# Patient Record
Sex: Female | Born: 1990 | Race: Black or African American | Hispanic: No | Marital: Single | Smoking: Never smoker
Health system: Southern US, Community
[De-identification: ages and names within clinical notes are randomized; demographics above are authoritative.]

## PROBLEM LIST (undated history)

## (undated) HISTORY — PX: OTHER SURGICAL HISTORY: SHX169

---

## 2016-04-14 ENCOUNTER — Ambulatory Visit (INDEPENDENT_AMBULATORY_CARE_PROVIDER_SITE_OTHER): Payer: Self-pay | Admitting: Obstetrics & Gynecology

## 2016-04-14 ENCOUNTER — Encounter: Payer: Self-pay | Admitting: Obstetrics & Gynecology

## 2016-04-14 VITALS — BP 121/80 | HR 74 | Temp 97.6°F | Ht 66.0 in | Wt 209.9 lb

## 2016-04-14 DIAGNOSIS — N926 Irregular menstruation, unspecified: Secondary | ICD-10-CM

## 2016-04-14 NOTE — Progress Notes (Signed)
   Subjective:    Patient ID: Cheryl Greene, female    DOB: 12-Mar-1990, 26 y.o.   MRN: [id number]  HPI 26 yo engaged AA P27 (57 yo son) here today with the issue of heavy periods since the c/s of her son 9 years ago. Her periods are not exactly regular. She skipped 4 months this summer. She had a regular period in Oct and November. Since then she had some spotting around Christmas, a light period in January. The first week of March it was very heavy.  She was on depo provera for about 5-6 years after her delivery and OCPs for about 2 years.  She has only a few chin hairs that are an issue.  She stopped contraception in about a year ago because she would like a pregnancy.  She has recurrent BV.   Review of Systems She moved here from [place] for her job at [employer] Her last pap smear was this year and was normal.    Objective:   Physical Exam WNWBFNAD Breathing, conversing, and ambulating normally Abd- benign Normal nulliparous cervix, moderate amount of mucousy blood ULN size AV uterus, NT, nl palpable adnexal masses       Assessment & Plan:  Recurrent BV- rec Boric acid supp Desire for pregnancy- start MVI daily to prevent spina bifida Irregular periods- check TSH, gyn u/s RTC 2 weeks

## 2016-04-14 NOTE — Progress Notes (Signed)
Patient is here for AUB, very heavy periods lasting more than 10 days. LMP 01/19/16, still bleeding.

## 2016-04-15 LAB — CBC
HEMOGLOBIN: 8.4 g/dL — AB (ref 11.1–15.9)
Hematocrit: 25.8 % — ABNORMAL LOW (ref 34.0–46.6)
MCH: 29.1 pg (ref 26.6–33.0)
MCHC: 32.6 g/dL (ref 31.5–35.7)
MCV: 89 fL (ref 79–97)
Platelets: 325 10*3/uL (ref 150–379)
RBC: 2.89 x10E6/uL — AB (ref 3.77–5.28)
RDW: 14.7 % (ref 12.3–15.4)
WBC: 4.8 10*3/uL (ref 3.4–10.8)

## 2016-04-15 LAB — TSH: TSH: 0.709 u[IU]/mL (ref 0.450–4.500)

## 2016-04-18 ENCOUNTER — Ambulatory Visit (HOSPITAL_COMMUNITY)
Admission: RE | Admit: 2016-04-18 | Discharge: 2016-04-18 | Disposition: A | Discharge status: Home / Self Care | Payer: Medicaid Other | Source: Ambulatory Visit | Attending: Obstetrics & Gynecology | Admitting: Obstetrics & Gynecology

## 2016-04-18 DIAGNOSIS — N926 Irregular menstruation, unspecified: Secondary | ICD-10-CM | POA: Insufficient documentation

## 2016-04-23 ENCOUNTER — Encounter: Payer: Self-pay | Admitting: General Practice

## 2016-05-14 ENCOUNTER — Ambulatory Visit (INDEPENDENT_AMBULATORY_CARE_PROVIDER_SITE_OTHER): Payer: Self-pay | Admitting: Obstetrics & Gynecology

## 2016-05-14 VITALS — BP 128/74 | HR 76 | Ht 66.0 in | Wt 213.5 lb

## 2016-05-14 DIAGNOSIS — N926 Irregular menstruation, unspecified: Secondary | ICD-10-CM

## 2016-05-14 MED ORDER — NORGESTREL-ETHINYL ESTRADIOL 0.3-30 MG-MCG PO TABS: 1.0000 | ORAL_TABLET | Freq: Every day | ORAL | 11 refills | Status: AC

## 2016-05-14 MED ORDER — FERROUS SULFATE 325 (65 FE) MG PO TBEC: 325.0000 mg | DELAYED_RELEASE_TABLET | Freq: Three times a day (TID) | ORAL | 3 refills | Status: AC

## 2016-05-14 NOTE — Progress Notes (Signed)
   Subjective:    Patient ID: Cheryl Greene, female    DOB: 1990/02/12, 26 y.o.   MRN: [id number]  HPI 26 yo S AA P1 (18 yo son) here for follow up for work up of heavy irregular periods. Her u/s was normal but her hbg was 8.4.  She is feeling anemic and has felt this way in the past, after the delivery of her son. Her symptoms are of craving some ice but mostly baby powder. She has been eating about 1/3 of a small bottle daily for about the last month. This is constipating her. Even though she would like a pregnancy, she is willing to take OCPs to regulate/lighten her periods, also willing to take iron BID.   Review of Systems     Objective:   Physical Exam WNWHBFNAD Breathing, conversing, and ambulating normally       Assessment & Plan:  As above-  Iron BID Start OCPs with NMP Start MVI daily RTC 4 weeks for re check of CBC

## 2016-06-11 ENCOUNTER — Other Ambulatory Visit: Payer: Self-pay

## 2016-06-11 DIAGNOSIS — N921 Excessive and frequent menstruation with irregular cycle: Secondary | ICD-10-CM

## 2016-06-12 LAB — CBC
HEMATOCRIT: 32.4 % — AB (ref 34.0–46.6)
HEMOGLOBIN: 10.2 g/dL — AB (ref 11.1–15.9)
MCH: 26.4 pg — ABNORMAL LOW (ref 26.6–33.0)
MCHC: 31.5 g/dL (ref 31.5–35.7)
MCV: 84 fL (ref 79–97)
Platelets: 236 10*3/uL (ref 150–379)
RBC: 3.87 x10E6/uL (ref 3.77–5.28)
RDW: 19 % — AB (ref 12.3–15.4)
WBC: 5.3 10*3/uL (ref 3.4–10.8)

## 2016-11-07 ENCOUNTER — Inpatient Hospital Stay (HOSPITAL_COMMUNITY)
Admission: EM | Admit: 2016-11-07 | Discharge: 2016-11-10 | DRG: 872 | Disposition: A | Discharge status: Home / Self Care | Payer: Medicaid Other | Attending: Internal Medicine | Admitting: Internal Medicine

## 2016-11-07 ENCOUNTER — Encounter (HOSPITAL_COMMUNITY): Payer: Self-pay | Admitting: Emergency Medicine

## 2016-11-07 ENCOUNTER — Emergency Department (HOSPITAL_COMMUNITY): Payer: Medicaid Other

## 2016-11-07 DIAGNOSIS — D638 Anemia in other chronic diseases classified elsewhere: Secondary | ICD-10-CM | POA: Diagnosis present

## 2016-11-07 DIAGNOSIS — A419 Sepsis, unspecified organism: Secondary | ICD-10-CM

## 2016-11-07 DIAGNOSIS — N12 Tubulo-interstitial nephritis, not specified as acute or chronic: Secondary | ICD-10-CM | POA: Diagnosis present

## 2016-11-07 DIAGNOSIS — A4151 Sepsis due to Escherichia coli [E. coli]: Principal | ICD-10-CM | POA: Diagnosis present

## 2016-11-07 LAB — URINALYSIS, ROUTINE W REFLEX MICROSCOPIC
Bilirubin Urine: NEGATIVE
GLUCOSE, UA: 50 mg/dL — AB
Ketones, ur: 5 mg/dL — AB
NITRITE: POSITIVE — AB
PH: 6 (ref 5.0–8.0)
Protein, ur: 100 mg/dL — AB
SPECIFIC GRAVITY, URINE: 1.014 (ref 1.005–1.030)

## 2016-11-07 LAB — I-STAT TROPONIN, ED: TROPONIN I, POC: 0 ng/mL (ref 0.00–0.08)

## 2016-11-07 LAB — CBC WITH DIFFERENTIAL/PLATELET
BASOS PCT: 0 %
Basophils Absolute: 0 10*3/uL (ref 0.0–0.1)
EOS ABS: 0 10*3/uL (ref 0.0–0.7)
EOS PCT: 0 %
HCT: 31.3 % — ABNORMAL LOW (ref 36.0–46.0)
HEMOGLOBIN: 10.5 g/dL — AB (ref 12.0–15.0)
Lymphocytes Relative: 6 %
Lymphs Abs: 0.7 10*3/uL (ref 0.7–4.0)
MCH: 29.4 pg (ref 26.0–34.0)
MCHC: 33.5 g/dL (ref 30.0–36.0)
MCV: 87.7 fL (ref 78.0–100.0)
MONO ABS: 0.5 10*3/uL (ref 0.1–1.0)
MONOS PCT: 4 %
NEUTROS PCT: 90 %
Neutro Abs: 11.2 10*3/uL — ABNORMAL HIGH (ref 1.7–7.7)
PLATELETS: 229 10*3/uL (ref 150–400)
RBC: 3.57 MIL/uL — ABNORMAL LOW (ref 3.87–5.11)
RDW: 12.7 % (ref 11.5–15.5)
WBC: 12.5 10*3/uL — ABNORMAL HIGH (ref 4.0–10.5)

## 2016-11-07 LAB — COMPREHENSIVE METABOLIC PANEL
ALK PHOS: 57 U/L (ref 38–126)
ALT: 42 U/L (ref 14–54)
AST: 39 U/L (ref 15–41)
Albumin: 3.6 g/dL (ref 3.5–5.0)
Anion gap: 11 (ref 5–15)
BUN: 9 mg/dL (ref 6–20)
CALCIUM: 8.8 mg/dL — AB (ref 8.9–10.3)
CO2: 18 mmol/L — AB (ref 22–32)
CREATININE: 0.78 mg/dL (ref 0.44–1.00)
Chloride: 106 mmol/L (ref 101–111)
Glucose, Bld: 136 mg/dL — ABNORMAL HIGH (ref 65–99)
Potassium: 3.3 mmol/L — ABNORMAL LOW (ref 3.5–5.1)
SODIUM: 135 mmol/L (ref 135–145)
Total Bilirubin: 0.9 mg/dL (ref 0.3–1.2)
Total Protein: 7.2 g/dL (ref 6.5–8.1)

## 2016-11-07 LAB — I-STAT CG4 LACTIC ACID, ED
Lactic Acid, Venous: 2.54 mmol/L (ref 0.5–1.9)
Lactic Acid, Venous: 1.79 mmol/L (ref 0.5–1.9)

## 2016-11-07 LAB — I-STAT BETA HCG BLOOD, ED (MC, WL, AP ONLY): I-stat hCG, quantitative: <5 m[IU]/mL (ref <5)

## 2016-11-07 MED ORDER — DEXTROSE 5 % IV SOLN
2.0000 g | INTRAVENOUS | Status: DC
  Administered 2016-11-08 – 2016-11-10 (×3): 2 g via INTRAVENOUS
  Filled 2016-11-07 (×3): qty 2

## 2016-11-07 MED ORDER — MORPHINE SULFATE (PF) 4 MG/ML IV SOLN
4.0000 mg | Freq: Once | INTRAVENOUS | Status: AC
  Administered 2016-11-07: 4 mg via INTRAVENOUS
  Filled 2016-11-07: qty 1

## 2016-11-07 MED ORDER — ACETAMINOPHEN 325 MG PO TABS
650.0000 mg | ORAL_TABLET | Freq: Four times a day (QID) | ORAL | Status: DC | PRN
  Administered 2016-11-07 – 2016-11-08 (×4): 650 mg via ORAL
  Filled 2016-11-07 (×4): qty 2

## 2016-11-07 MED ORDER — KETOROLAC TROMETHAMINE 30 MG/ML IJ SOLN: 15.0000 mg | Freq: Once | INTRAMUSCULAR | Status: DC

## 2016-11-07 MED ORDER — SODIUM CHLORIDE 0.9 % IV SOLN
INTRAVENOUS | Status: AC
  Administered 2016-11-07 – 2016-11-08 (×2): via INTRAVENOUS

## 2016-11-07 MED ORDER — ENOXAPARIN SODIUM 40 MG/0.4ML ~~LOC~~ SOLN
40.0000 mg | SUBCUTANEOUS | Status: DC
  Administered 2016-11-07 – 2016-11-09 (×3): 40 mg via SUBCUTANEOUS
  Filled 2016-11-07 (×3): qty 0.4

## 2016-11-07 MED ORDER — SODIUM CHLORIDE 0.9 % IV BOLUS (SEPSIS)
1000.0000 mL | Freq: Once | INTRAVENOUS | Status: AC
  Administered 2016-11-07: 1000 mL via INTRAVENOUS

## 2016-11-07 MED ORDER — KETOROLAC TROMETHAMINE 30 MG/ML IJ SOLN
30.0000 mg | Freq: Once | INTRAMUSCULAR | Status: AC
  Administered 2016-11-07: 30 mg via INTRAVENOUS
  Filled 2016-11-07: qty 1

## 2016-11-07 MED ORDER — NORGESTREL-ETHINYL ESTRADIOL 0.3-30 MG-MCG PO TABS
1.0000 | ORAL_TABLET | Freq: Every day | ORAL | Status: DC
  Administered 2016-11-07 – 2016-11-10 (×4): 1 via ORAL

## 2016-11-07 MED ORDER — CEFTRIAXONE SODIUM 2 G IJ SOLR
2.0000 g | Freq: Once | INTRAMUSCULAR | Status: AC
  Administered 2016-11-07: 2 g via INTRAVENOUS
  Filled 2016-11-07: qty 2

## 2016-11-07 MED ORDER — FERROUS SULFATE 325 (65 FE) MG PO TBEC
325.0000 mg | DELAYED_RELEASE_TABLET | Freq: Three times a day (TID) | ORAL | Status: DC
  Administered 2016-11-07 – 2016-11-08 (×2): 325 mg via ORAL
  Filled 2016-11-07 (×4): qty 1

## 2016-11-07 MED ORDER — ACETAMINOPHEN 650 MG RE SUPP: 650.0000 mg | Freq: Four times a day (QID) | RECTAL | Status: DC | PRN

## 2016-11-07 MED ORDER — ACETAMINOPHEN 325 MG PO TABS
650.0000 mg | ORAL_TABLET | Freq: Once | ORAL | Status: AC
  Administered 2016-11-07: 650 mg via ORAL
  Filled 2016-11-07: qty 2

## 2016-11-07 MED ORDER — CEFTRIAXONE SODIUM 2 G IJ SOLR: 2.0000 g | Freq: Once | INTRAMUSCULAR | Status: DC

## 2016-11-07 MED ORDER — ONDANSETRON HCL 4 MG/2ML IJ SOLN
4.0000 mg | Freq: Once | INTRAMUSCULAR | Status: AC
  Administered 2016-11-07: 4 mg via INTRAVENOUS
  Filled 2016-11-07: qty 2

## 2016-11-07 MED ORDER — ONDANSETRON HCL 4 MG/2ML IJ SOLN: 4.0000 mg | Freq: Four times a day (QID) | INTRAMUSCULAR | Status: DC | PRN

## 2016-11-07 MED ORDER — ONDANSETRON HCL 4 MG PO TABS: 4.0000 mg | ORAL_TABLET | Freq: Four times a day (QID) | ORAL | Status: DC | PRN

## 2016-11-07 NOTE — ED Provider Notes (Signed)
[facility] Provider Note   CSN: [id number] Arrival date & time: 11/07/16  [id number]     History   Chief Complaint Chief Complaint  Patient presents with  . Back Pain    HPI Cheryl Greene is a 26 y.o. female.  Cheryl Greene Cheryl Greene is a 26 y.o. Female who presents to the ED complaining of chills, mid upper back pain, SOB and dark urine. Patient reports about one week ago she started having dark colored urine, dysuria, urinary frequency and urinary urgency.  She reports it seemed to be getting better over the past couple days, however she still is having the symptoms.  She reports yesterday evening she developed some cold chills and took Tylenol at 5 PM. No treatments prior to arrival today. She denies history of pyelonephritis. She denies abdominal pain, nausea, vomiting, coughing, chest pain, neck pain, rashes, vaginal discharge, low back pain, trouble urinating, or loss of bowel or bladder control.    The history is provided by the patient and medical records. No language interpreter was used.  Back Pain   Associated symptoms include a fever and dysuria. Pertinent negatives include no chest pain, no headaches, no abdominal pain and no pelvic pain.    History reviewed. No pertinent past medical history.  Patient Active Problem List   Diagnosis Date Noted  . Sepsis (HCC) 11/07/2016    Past Surgical History:  Procedure Laterality Date  . c section      OB History    No data available       Home Medications    Prior to Admission medications   Medication Sig Start Date End Date Taking? Authorizing Provider  acetaminophen (TYLENOL) 500 MG tablet Take 1,000 mg by mouth every 6 (six) hours as needed for mild pain.   Yes [provider]  ferrous sulfate 325 (65 FE) MG EC tablet Take 1 tablet (325 mg total) by mouth 3 (three) times daily with meals. 05/14/16  Yes Dove, Myra C, MD  Multiple Vitamin (MULTIVITAMIN WITH MINERALS)  TABS tablet Take 2 tablets by mouth daily.   Yes [provider]  naproxen sodium (ANAPROX) 220 MG tablet Take 220-440 mg by mouth 2 (two) times daily as needed (mild pain).   Yes [provider]  norgestrel-ethinyl estradiol (LO/OVRAL,CRYSELLE) 0.3-30 MG-MCG tablet Take 1 tablet by mouth daily. 05/14/16  Yes Allie Bossierove, Myra C, MD    Family History No family history on file.  Social History Social History  Substance Use Topics  . Smoking status: Never Smoker  . Smokeless tobacco: Never Used  . Alcohol use No     Comment: occassionally     Allergies   Patient has no known allergies.   Review of Systems Review of Systems  Constitutional: Positive for chills and fever.  HENT: Negative for congestion and sore throat.   Eyes: Negative for visual disturbance.  Respiratory: Negative for cough and shortness of breath.   Cardiovascular: Negative for chest pain, palpitations and leg swelling.  Gastrointestinal: Negative for abdominal pain, diarrhea, nausea and vomiting.  Genitourinary: Positive for dysuria, frequency and urgency. Negative for decreased urine volume, difficulty urinating, flank pain, genital sores, hematuria, menstrual problem, pelvic pain, vaginal bleeding and vaginal discharge.  Musculoskeletal: Positive for back pain. Negative for neck pain.  Skin: Negative for rash.  Neurological: Negative for headaches.     Physical Exam Updated Vital Signs BP 118/70 (BP Location: Left Arm)   Pulse (!) 101   Temp 99.3 F (37.4  C) (Oral)   Resp 18   Ht 5\' 6"  (1.676 m)   Wt 97.5 kg (215 lb)   LMP 10/08/2016   SpO2 98%   BMI 34.70 kg/m   Physical Exam  Constitutional: She appears well-developed and well-nourished. No distress.  Nontoxic-appearing.  HENT:  Head: Normocephalic and atraumatic.  Mouth/Throat: Oropharynx is clear and moist.  Eyes: Pupils are equal, round, and reactive to light. Conjunctivae are normal. Right eye exhibits no discharge. Left eye  exhibits no discharge.  Neck: Normal range of motion. Neck supple. No JVD present.  Cardiovascular: Regular rhythm, normal heart sounds and intact distal pulses.  Exam reveals no gallop and no friction rub.   No murmur heard. Heart rate 126.  Pulmonary/Chest: Effort normal and breath sounds normal. No stridor. No respiratory distress. She has no wheezes. She has no rales.  Lungs are clear to ascultation bilaterally. Symmetric chest expansion bilaterally. No increased work of breathing. No rales or rhonchi.    Abdominal: Soft. Bowel sounds are normal. She exhibits no distension and no mass. There is no tenderness. There is no guarding.  Abdomen is soft and nontender to palpation.  No CVA or flank tenderness.  Musculoskeletal: She exhibits no edema or tenderness.  No lower extremity edema or tenderness.  Lymphadenopathy:    She has no cervical adenopathy.  Neurological: She is alert. Coordination normal.  Skin: Skin is warm and dry. Capillary refill takes less than 2 seconds. No rash noted. She is not diaphoretic. No erythema. No pallor.  Psychiatric: She has a normal mood and affect. Her behavior is normal.  Nursing note and vitals reviewed.    ED Treatments / Results  Labs (all labs ordered are listed, but only abnormal results are displayed) Labs Reviewed  COMPREHENSIVE METABOLIC PANEL - Abnormal; Notable for the following:       Result Value   Potassium 3.3 (*)    CO2 18 (*)    Glucose, Bld 136 (*)    Calcium 8.8 (*)    All other components within normal limits  CBC WITH DIFFERENTIAL/PLATELET - Abnormal; Notable for the following:    WBC 12.5 (*)    RBC 3.57 (*)    Hemoglobin 10.5 (*)    HCT 31.3 (*)    Neutro Abs 11.2 (*)    All other components within normal limits  URINALYSIS, ROUTINE W REFLEX MICROSCOPIC - Abnormal; Notable for the following:    APPearance HAZY (*)    Glucose, UA 50 (*)    Hgb urine dipstick MODERATE (*)    Ketones, ur 5 (*)    Protein, ur 100 (*)     Nitrite POSITIVE (*)    Leukocytes, UA LARGE (*)    Bacteria, UA MANY (*)    Squamous Epithelial / LPF 0-5 (*)    All other components within normal limits  I-STAT CG4 LACTIC ACID, ED - Abnormal; Notable for the following:    Lactic Acid, Venous 2.54 (*)    All other components within normal limits  CULTURE, BLOOD (ROUTINE X 2)  CULTURE, BLOOD (ROUTINE X 2)  URINE CULTURE  HIV ANTIBODY (ROUTINE TESTING)  BASIC METABOLIC PANEL  CBC  I-STAT BETA HCG BLOOD, ED ([facility], [facility], [facility] ONLY)  I-STAT TROPONIN, ED  I-STAT CG4 LACTIC ACID, ED    EKG  EKG Interpretation  Date/Time:  Friday November 07 2016 09:32:40 EDT Ventricular Rate:  118 PR Interval:    QRS Duration: 102 QT Interval:  315 QTC Calculation: 442 R  Axis:   18 Text Interpretation:  Sinus tachycardia Low voltage, precordial leads RSR' in V1 or V2, right VCD or RVH Probable Incomplete right bundle branch block No old tracing to compare Confirmed by Dione Booze ([id number]) on 11/07/2016 9:38:56 AM Also confirmed by Dione Booze ([id number]), editor Madalyn Rob [phone])  on 11/07/2016 10:08:51 AM       Radiology Dg Chest 2 View  Result Date: 11/07/2016 CLINICAL DATA:  Upper back pain, shortness of Breath EXAM: CHEST  2 VIEW COMPARISON:  None. FINDINGS: Heart and mediastinal contours are within normal limits. No focal opacities or effusions. No acute bony abnormality. IMPRESSION: No active cardiopulmonary disease. Electronically Signed   By: Charlett Nose M.D.   On: 11/07/2016 10:43   Ct Renal Stone Study  Result Date: 11/07/2016 CLINICAL DATA:  Back pain for several hours EXAM: CT ABDOMEN AND PELVIS WITHOUT CONTRAST TECHNIQUE: Multidetector CT imaging of the abdomen and pelvis was performed following the standard protocol without IV contrast. COMPARISON:  None. FINDINGS: Lower chest: No acute abnormality. Hepatobiliary: No focal liver abnormality is seen. No gallstones, gallbladder wall thickening, or biliary dilatation. Pancreas:  Unremarkable. No pancreatic ductal dilatation or surrounding inflammatory changes. Spleen: Normal in size without focal abnormality. Adrenals/Urinary Tract: Adrenal glands are unremarkable. Kidneys are normal, without renal calculi, focal lesion, or hydronephrosis. Bladder is unremarkable. Stomach/Bowel: The appendix is within normal limits. No obstructive or inflammatory changes are noted. Vascular/Lymphatic: No significant vascular findings are present. No enlarged abdominal or pelvic lymph nodes. Reproductive: Uterus and bilateral adnexa are unremarkable. Other: No abdominal wall hernia or abnormality. No abdominopelvic ascites. Musculoskeletal: No acute bony abnormality is noted. A sclerotic focus is noted in the right iliac bone recent to the sacroiliac joint likely related to underlying degenerative change. Mild spurring at the sacroiliac joint is noted. IMPRESSION: No acute abnormality is noted. Electronically Signed   By: Alcide Clever M.D.   On: 11/07/2016 13:42    Procedures Procedures (including critical care time)  CRITICAL CARE Performed by: Lawana Chambers   Total critical care time: 45 minutes  Critical care time was exclusive of separately billable procedures and treating other patients.  Critical care was necessary to treat or prevent imminent or life-threatening deterioration.  Critical care was time spent personally by me on the following activities: development of treatment plan with patient and/or surrogate as well as nursing, discussions with consultants, evaluation of patient's response to treatment, examination of patient, obtaining history from patient or surrogate, ordering and performing treatments and interventions, ordering and review of laboratory studies, ordering and review of radiographic studies, pulse oximetry and re-evaluation of patient's condition.   Medications Ordered in ED Medications  cefTRIAXone (ROCEPHIN) 2 g in dextrose 5 % 50 mL IVPB (2 g  Intravenous Not Given 11/07/16 1208)  ferrous sulfate EC tablet 325 mg (not administered)  norgestrel-ethinyl estradiol (LO/OVRAL,CRYSELLE) 0.3-30 MG-MCG per tablet 1 tablet (not administered)  enoxaparin (LOVENOX) injection 40 mg (40 mg Subcutaneous Given 11/07/16 1631)  0.9 %  sodium chloride infusion ( Intravenous New Bag/Given 11/07/16 1631)  acetaminophen (TYLENOL) tablet 650 mg (not administered)    Or  acetaminophen (TYLENOL) suppository 650 mg (not administered)  ondansetron (ZOFRAN) tablet 4 mg (not administered)    Or  ondansetron (ZOFRAN) injection 4 mg (not administered)  sodium chloride 0.9 % bolus 1,000 mL (0 mLs Intravenous Stopped 11/07/16 1036)  sodium chloride 0.9 % bolus 1,000 mL (0 mLs Intravenous Stopped 11/07/16 1200)  acetaminophen (TYLENOL) tablet 650 mg (650 mg Oral  Given 11/07/16 1016)  cefTRIAXone (ROCEPHIN) 2 g in dextrose 5 % 50 mL IVPB (0 g Intravenous Stopped 11/07/16 1100)  morphine 4 MG/ML injection 4 mg (4 mg Intravenous Given 11/07/16 1102)  ondansetron (ZOFRAN) injection 4 mg (4 mg Intravenous Given 11/07/16 1102)  sodium chloride 0.9 % bolus 1,000 mL (1,000 mLs Intravenous New Bag/Given 11/07/16 1403)  ketorolac (TORADOL) 30 MG/ML injection 30 mg (30 mg Intravenous Given 11/07/16 1403)     Initial Impression / Assessment and Plan / ED Course  I have reviewed the triage vital signs and the nursing notes.  Pertinent labs & imaging results that were available during my care of the patient were reviewed by me and considered in my medical decision making (see chart for details).    This is a 26 y.o. Female who presents to the ED complaining of chills, mid upper back pain, SOB and dark urine. Patient reports about one week ago she started having dark colored urine, dysuria, urinary frequency and urinary urgency.  She reports it seemed to be getting better over the past couple days, however she still is having the symptoms.  She reports yesterday evening she  developed some cold chills and took Tylenol at 5 PM. No treatments prior to arrival today. She denies history of pyelonephritis.  On arrival to the emergency department the patient is febrile and tachycardic to 130.  On exam patient's abdomen is soft and nontender.  She has no CVA or flank tenderness but does complain of back pain.  Lactic acid returned elevated at 2.54.  Code sepsis orders initiated.  CBC shows leukocytosis with a white count of 12,500.  CMP shows good kidney function.  Pregnancy test is negative.  Urinalysis is nitrite positive with large leukocytes and too numerous to count white blood cells.  Urine sent for culture.  At reevaluation patient is having severe flank pain.  She feels very uncomfortable.  Will obtain a CT renal stone study to rule out kidney stone.  Patient with at least pyelonephritis.  Keflex 2 g started.  CT renal stone study is unremarkable.  At reevaluation she is feeling much better.  Lactic has improved.  She still tachycardic.  She needs admission for observation for improvement of her tachycardia and to ensure she is feeling better.  She agrees with plan for admission.  Dr. Melynda Ripple accepted the patient for admission.   This patient was discussed with and evaluated by Dr. Preston Fleeting who agrees with assessment and plan.  Final Clinical Impressions(s) / ED Diagnoses   Final diagnoses:  Sepsis, due to unspecified organism [facility])  Pyelonephritis    New Prescriptions Current Discharge Medication List       Everlene Farrier, Cordelia Poche 11/07/16 1735    Dione Booze, MD 11/08/16 [phone]

## 2016-11-07 NOTE — ED Notes (Signed)
Bed: [id number] Expected date:  Expected time:  Means of arrival:  Comments: EMS- 26yo F, back pain/chills

## 2016-11-07 NOTE — ED Notes (Signed)
ED TO INPATIENT HANDOFF REPORT  Name/Age/Gender Cheryl Greene 26 y.o. female  Code Status    Code Status Orders        Start     Ordered   11/07/16 1449  Full code  Continuous     11/07/16 1450    Code Status History    Date Active Date Inactive Code Status Order ID Comments User Context   This patient has a current code status but no historical code status.      Home/SNF/Other Home  Chief Complaint back pain  Level of Care/Admitting Diagnosis ED Disposition    ED Disposition Condition Comment   Admit  Hospital Area: [facility] [[id number]]  Level of Care: Telemetry [5]  Admit to tele based on following criteria: Other see comments  Comments: sepsis  Diagnosis: Sepsis [facility]) [[id number]]  Admitting Physician: Haydee Salter [[id number]]  Attending Physician: Melynda Ripple, Talmadge Coventry [[id number]]  PT Class (Do Not Modify): Observation [104]  PT Acc Code (Do Not Modify): Observation [10022]       Medical History History reviewed. No pertinent past medical history.  Allergies No Known Allergies  IV Location/Drains/Wounds Patient Lines/Drains/Airways Status   Active Line/Drains/Airways    Name:   Placement date:   Placement time:   Site:   Days:   Peripheral IV 11/07/16 Left Hand  11/07/16    0908    Hand    less than 1          Labs/Imaging Results for orders placed or performed during the hospital encounter of 11/07/16 (from the past 48 hour(s))  Urinalysis, Routine w reflex microscopic     Status: Abnormal   Collection Time: 11/07/16  8:58 AM  Result Value Ref Range   Color, Urine YELLOW YELLOW   APPearance HAZY (A) CLEAR   Specific Gravity, Urine 1.014 1.005 - 1.030   pH 6.0 5.0 - 8.0   Glucose, UA 50 (A) NEGATIVE mg/dL   Hgb urine dipstick MODERATE (A) NEGATIVE   Bilirubin Urine NEGATIVE NEGATIVE   Ketones, ur 5 (A) NEGATIVE mg/dL   Protein, ur 800 (A) NEGATIVE mg/dL   Nitrite POSITIVE (A) NEGATIVE   Leukocytes, UA LARGE (A) NEGATIVE    RBC / HPF 6-30 0 - 5 RBC/hpf   WBC, UA TOO NUMEROUS TO COUNT 0 - 5 WBC/hpf   Bacteria, UA MANY (A) NONE SEEN   Squamous Epithelial / LPF 0-5 (A) NONE SEEN   WBC Clumps PRESENT    Mucus PRESENT   Comprehensive metabolic panel     Status: Abnormal   Collection Time: 11/07/16  9:03 AM  Result Value Ref Range   Sodium 135 135 - 145 mmol/L   Potassium 3.3 (L) 3.5 - 5.1 mmol/L   Chloride 106 101 - 111 mmol/L   CO2 18 (L) 22 - 32 mmol/L   Glucose, Bld 136 (H) 65 - 99 mg/dL   BUN 9 6 - 20 mg/dL   Creatinine, Ser 4.47 0.44 - 1.00 mg/dL   Calcium 8.8 (L) 8.9 - 10.3 mg/dL   Total Protein 7.2 6.5 - 8.1 g/dL   Albumin 3.6 3.5 - 5.0 g/dL   AST 39 15 - 41 U/L   ALT 42 14 - 54 U/L   Alkaline Phosphatase 57 38 - 126 U/L   Total Bilirubin 0.9 0.3 - 1.2 mg/dL   GFR calc non Af Amer >60 >60 mL/min   GFR calc Af Amer >60 >60 mL/min    Comment: (NOTE) The eGFR  has been calculated using the CKD EPI equation. This calculation has not been validated in all clinical situations. eGFR's persistently <60 mL/min signify possible Chronic Kidney Disease.    Anion gap 11 5 - 15  CBC with Differential     Status: Abnormal   Collection Time: 11/07/16  9:03 AM  Result Value Ref Range   WBC 12.5 (H) 4.0 - 10.5 K/uL   RBC 3.57 (L) 3.87 - 5.11 MIL/uL   Hemoglobin 10.5 (L) 12.0 - 15.0 g/dL   HCT 31.3 (L) 36.0 - 46.0 %   MCV 87.7 78.0 - 100.0 fL   MCH 29.4 26.0 - 34.0 pg   MCHC 33.5 30.0 - 36.0 g/dL   RDW 12.7 11.5 - 15.5 %   Platelets 229 150 - 400 K/uL   Neutrophils Relative % 90 %   Neutro Abs 11.2 (H) 1.7 - 7.7 K/uL   Lymphocytes Relative 6 %   Lymphs Abs 0.7 0.7 - 4.0 K/uL   Monocytes Relative 4 %   Monocytes Absolute 0.5 0.1 - 1.0 K/uL   Eosinophils Relative 0 %   Eosinophils Absolute 0.0 0.0 - 0.7 K/uL   Basophils Relative 0 %   Basophils Absolute 0.0 0.0 - 0.1 K/uL  I-Stat Beta hCG blood, ED ([facility], [facility], [facility] only)     Status: None   Collection Time: 11/07/16  9:10 AM  Result Value Ref Range    I-stat hCG, quantitative <5.0 <5 mIU/mL   Comment 3            Comment:   GEST. AGE      CONC.  (mIU/mL)   <=1 WEEK        5 - 50     2 WEEKS       50 - 500     3 WEEKS       100 - 10,000     4 WEEKS     1,000 - 30,000        FEMALE AND NON-PREGNANT FEMALE:     LESS THAN 5 mIU/mL   I-Stat CG4 Lactic Acid, ED     Status: Abnormal   Collection Time: 11/07/16  9:13 AM  Result Value Ref Range   Lactic Acid, Venous 2.54 (HH) 0.5 - 1.9 mmol/L   Comment NOTIFIED PHYSICIAN   I-Stat Troponin, ED (not at [facility])     Status: None   Collection Time: 11/07/16  9:13 AM  Result Value Ref Range   Troponin i, poc 0.00 0.00 - 0.08 ng/mL   Comment 3            Comment: Due to the release kinetics of cTnI, a negative result within the first hours of the onset of symptoms does not rule out myocardial infarction with certainty. If myocardial infarction is still suspected, repeat the test at appropriate intervals.   I-Stat CG4 Lactic Acid, ED     Status: None   Collection Time: 11/07/16 12:09 PM  Result Value Ref Range   Lactic Acid, Venous 1.79 0.5 - 1.9 mmol/L   Dg Chest 2 View  Result Date: 11/07/2016 CLINICAL DATA:  Upper back pain, shortness of Breath EXAM: CHEST  2 VIEW COMPARISON:  None. FINDINGS: Heart and mediastinal contours are within normal limits. No focal opacities or effusions. No acute bony abnormality. IMPRESSION: No active cardiopulmonary disease. Electronically Signed   By: Rolm Baptise M.D.   On: 11/07/2016 10:43   Ct Renal Stone Study  Result Date: 11/07/2016 CLINICAL DATA:  Back pain for several hours EXAM: CT ABDOMEN AND PELVIS WITHOUT CONTRAST TECHNIQUE: Multidetector CT imaging of the abdomen and pelvis was performed following the standard protocol without IV contrast. COMPARISON:  None. FINDINGS: Lower chest: No acute abnormality. Hepatobiliary: No focal liver abnormality is seen. No gallstones, gallbladder wall thickening, or biliary dilatation. Pancreas: Unremarkable. No  pancreatic ductal dilatation or surrounding inflammatory changes. Spleen: Normal in size without focal abnormality. Adrenals/Urinary Tract: Adrenal glands are unremarkable. Kidneys are normal, without renal calculi, focal lesion, or hydronephrosis. Bladder is unremarkable. Stomach/Bowel: The appendix is within normal limits. No obstructive or inflammatory changes are noted. Vascular/Lymphatic: No significant vascular findings are present. No enlarged abdominal or pelvic lymph nodes. Reproductive: Uterus and bilateral adnexa are unremarkable. Other: No abdominal wall hernia or abnormality. No abdominopelvic ascites. Musculoskeletal: No acute bony abnormality is noted. A sclerotic focus is noted in the right iliac bone recent to the sacroiliac joint likely related to underlying degenerative change. Mild spurring at the sacroiliac joint is noted. IMPRESSION: No acute abnormality is noted. Electronically Signed   By: Alcide Clever M.D.   On: 11/07/2016 13:42    Pending Labs Unresulted Labs    Start     Ordered   11/14/16 0500  Creatinine, serum  (enoxaparin (LOVENOX)    CrCl >/= 30 ml/min)  Weekly,   R    Comments:  while on enoxaparin therapy    11/07/16 1450   11/08/16 0500  Basic metabolic panel  Tomorrow morning,   R     11/07/16 1450   11/08/16 0500  CBC  Tomorrow morning,   R     11/07/16 1450   11/07/16 1449  HIV antibody (Routine Testing)  Once,   R     11/07/16 1450   11/07/16 1449  CBC  (enoxaparin (LOVENOX)    CrCl >/= 30 ml/min)  Once,   R    Comments:  Baseline for enoxaparin therapy IF NOT ALREADY DRAWN.  Notify MD if PLT < 100 K.    11/07/16 1450   11/07/16 1449  Creatinine, serum  (enoxaparin (LOVENOX)    CrCl >/= 30 ml/min)  Once,   R    Comments:  Baseline for enoxaparin therapy IF NOT ALREADY DRAWN.    11/07/16 1450   11/07/16 1155  Urine Culture  Once,   STAT     11/07/16 1154   11/07/16 0918  Culture, blood (routine x 2)  BLOOD CULTURE X 2,   STAT     11/07/16 0917       Vitals/Pain Today's Vitals   11/07/16 1234 11/07/16 1330 11/07/16 1355 11/07/16 1400  BP:  139/85  133/70  Pulse:  (!) 117 (!) 122 (!) 112  Resp:  18 13 (!) 23  Temp:      TempSrc:      SpO2:  99%  100%  Weight:      Height:      PainSc: 5        Isolation Precautions No active isolations  Medications Medications  cefTRIAXone (ROCEPHIN) 2 g in dextrose 5 % 50 mL IVPB (2 g Intravenous Not Given 11/07/16 1208)  ferrous sulfate EC tablet 325 mg (not administered)  norgestrel-ethinyl estradiol (LO/OVRAL,CRYSELLE) 0.3-30 MG-MCG per tablet 1 tablet (not administered)  enoxaparin (LOVENOX) injection 40 mg (not administered)  0.9 %  sodium chloride infusion (not administered)  acetaminophen (TYLENOL) tablet 650 mg (not administered)    Or  acetaminophen (TYLENOL) suppository 650 mg (not administered)  ondansetron (ZOFRAN)  tablet 4 mg (not administered)    Or  ondansetron (ZOFRAN) injection 4 mg (not administered)  sodium chloride 0.9 % bolus 1,000 mL (0 mLs Intravenous Stopped 11/07/16 1036)  sodium chloride 0.9 % bolus 1,000 mL (0 mLs Intravenous Stopped 11/07/16 1200)  acetaminophen (TYLENOL) tablet 650 mg (650 mg Oral Given 11/07/16 1016)  cefTRIAXone (ROCEPHIN) 2 g in dextrose 5 % 50 mL IVPB (0 g Intravenous Stopped 11/07/16 1100)  morphine 4 MG/ML injection 4 mg (4 mg Intravenous Given 11/07/16 1102)  ondansetron (ZOFRAN) injection 4 mg (4 mg Intravenous Given 11/07/16 1102)  sodium chloride 0.9 % bolus 1,000 mL (1,000 mLs Intravenous New Bag/Given 11/07/16 1403)  ketorolac (TORADOL) 30 MG/ML injection 30 mg (30 mg Intravenous Given 11/07/16 1403)    Mobility walks

## 2016-11-07 NOTE — Progress Notes (Signed)
A consult was received from an ED physician for Rocephin per pharmacy dosing.  The patient's profile has been reviewed for ht/wt/allergies/indication/available labs.   A one time order has been placed for 1g IV x 1.  Further antibiotics/pharmacy consults should be ordered by admitting physician if indicated.                       Thank you, Veronica Fretz A 11/07/2016  10:15 AM

## 2016-11-07 NOTE — ED Notes (Signed)
Patient transported to X-ray 

## 2016-11-07 NOTE — ED Notes (Signed)
[place] to [place] at [phone] at [phone]

## 2016-11-07 NOTE — H&P (Signed)
[facility] History and Physical  Cheryl AlstromDenechea Ton ONG:[id number] DOB: 05/26/1990 DOA: 11/07/2016  Referring physician:  PCP: Patient, No Pcp Per   Chief Complaint: back pain  HPI: Cheryl Greene is a 26 y.o. female with no significant past medical history presents to the emergency room with back pain.  Patient states she had very intense back pain.  She tried to treat it at home and it resolved.  The back pain reoccurred and was severe.  Patient came to emergency room for evaluation of the back pain.  Patient denies any history of pyelonephritis.  Does not have a history of frequent urinary tract infections.  Does not have a history of kidney disease.  Does not have a history of kidney abnormality.  Patient does not have a history of kidney stones.  No family history of kidney stones.  ED course: CT stone study done that was negative.  Chest x-ray also negative.  UA positive for UTI.  Pilo based on exam by EDP.  Patient started on antibiotics.  Hospitalist consulted for admission.   Review of Systems:  As per HPI otherwise 10 point review of systems negative.    History reviewed. No pertinent past medical history. Past Surgical History:  Procedure Laterality Date  . c section     Social History:  reports that she has never smoked. She has never used smokeless tobacco. She reports that she does not drink alcohol or use drugs.  No Known Allergies  No family history on file.   Prior to Admission medications   Medication Sig Start Date End Date Taking? Authorizing Provider  acetaminophen (TYLENOL) 500 MG tablet Take 1,000 mg by mouth every 6 (six) hours as needed for mild pain.   Yes [provider]  ferrous sulfate 325 (65 FE) MG EC tablet Take 1 tablet (325 mg total) by mouth 3 (three) times daily with meals. 05/14/16  Yes Dove, Myra C, MD  Multiple Vitamin (MULTIVITAMIN WITH MINERALS) TABS tablet Take 2 tablets by mouth daily.   Yes [provider]    naproxen sodium (ANAPROX) 220 MG tablet Take 220-440 mg by mouth 2 (two) times daily as needed (mild pain).   Yes [provider]  norgestrel-ethinyl estradiol (LO/OVRAL,CRYSELLE) 0.3-30 MG-MCG tablet Take 1 tablet by mouth daily. 05/14/16  Yes Allie Bossierove, Myra C, MD   Physical Exam: Vitals:   11/07/16 1229 11/07/16 1330 11/07/16 1355 11/07/16 1400  BP: 139/80 139/85  133/70  Pulse: (!) 135 (!) 117 (!) 122 (!) 112  Resp: 17 18 13  (!) 23  Temp: (!) 101.4 F (38.6 C)     TempSrc: Oral     SpO2: 99% 99%  100%  Weight:      Height:        Wt Readings from Last 3 Encounters:  11/07/16 97.5 kg (215 lb)  05/14/16 96.8 kg (213 lb 8 oz)  04/14/16 95.2 kg (209 lb 14.4 oz)    General:  Appears calm and comfortable; A&Ox3 Eyes:  PERRL, EOMI, normal lids, iris ENT:  grossly normal hearing, lips & tongue Neck:  no LAD, masses or thyromegaly Cardiovascular: Regular rhythm, tachycardic, no m/r/g. No LE edema.  Respiratory:  CTA bilaterally, no w/r/r. Normal respiratory effort. Abdomen:  soft, ntnd, no CVA tenderness Skin:  no rash or induration seen on limited exam Musculoskeletal:  grossly normal tone BUE/BLE Psychiatric:  grossly normal mood and affect, speech fluent and appropriate Neurologic:  CN 2-12 grossly intact, moves all extremities in coordinated fashion.  Labs on Admission:  Basic Metabolic Panel:  Recent Labs Lab 11/07/16 0903  NA 135  K 3.3*  CL 106  CO2 18*  GLUCOSE 136*  BUN 9  CREATININE 0.78  CALCIUM 8.8*   Liver Function Tests:  Recent Labs Lab 11/07/16 0903  AST 39  ALT 42  ALKPHOS 57  BILITOT 0.9  PROT 7.2  ALBUMIN 3.6   No results for input(s): LIPASE, AMYLASE in the last 168 hours. No results for input(s): AMMONIA in the last 168 hours. CBC:  Recent Labs Lab 11/07/16 0903  WBC 12.5*  NEUTROABS 11.2*  HGB 10.5*  HCT 31.3*  MCV 87.7  PLT 229   Cardiac Enzymes: No results for input(s): CKTOTAL, CKMB, CKMBINDEX, TROPONINI in  the last 168 hours.  BNP (last 3 results) No results for input(s): BNP in the last 8760 hours.  ProBNP (last 3 results) No results for input(s): PROBNP in the last 8760 hours.   Serum creatinine: 0.78 mg/dL 60/45/40 9811 Estimated creatinine clearance: 124.4 mL/min  CBG: No results for input(s): GLUCAP in the last 168 hours.  Radiological Exams on Admission: Dg Chest 2 View  Result Date: 11/07/2016 CLINICAL DATA:  Upper back pain, shortness of Breath EXAM: CHEST  2 VIEW COMPARISON:  None. FINDINGS: Heart and mediastinal contours are within normal limits. No focal opacities or effusions. No acute bony abnormality. IMPRESSION: No active cardiopulmonary disease. Electronically Signed   By: Charlett Nose M.D.   On: 11/07/2016 10:43   Ct Renal Stone Study  Result Date: 11/07/2016 CLINICAL DATA:  Back pain for several hours EXAM: CT ABDOMEN AND PELVIS WITHOUT CONTRAST TECHNIQUE: Multidetector CT imaging of the abdomen and pelvis was performed following the standard protocol without IV contrast. COMPARISON:  None. FINDINGS: Lower chest: No acute abnormality. Hepatobiliary: No focal liver abnormality is seen. No gallstones, gallbladder wall thickening, or biliary dilatation. Pancreas: Unremarkable. No pancreatic ductal dilatation or surrounding inflammatory changes. Spleen: Normal in size without focal abnormality. Adrenals/Urinary Tract: Adrenal glands are unremarkable. Kidneys are normal, without renal calculi, focal lesion, or hydronephrosis. Bladder is unremarkable. Stomach/Bowel: The appendix is within normal limits. No obstructive or inflammatory changes are noted. Vascular/Lymphatic: No significant vascular findings are present. No enlarged abdominal or pelvic lymph nodes. Reproductive: Uterus and bilateral adnexa are unremarkable. Other: No abdominal wall hernia or abnormality. No abdominopelvic ascites. Musculoskeletal: No acute bony abnormality is noted. A sclerotic focus is noted in the  right iliac bone recent to the sacroiliac joint likely related to underlying degenerative change. Mild spurring at the sacroiliac joint is noted. IMPRESSION: No acute abnormality is noted. Electronically Signed   By: Alcide Clever M.D.   On: 11/07/2016 13:42    EKG: Independently reviewed.  Sinus tachycardia, incomplete right bundle branch block, no STEMI  Assessment/Plan Active Problems:   Sepsis (HCC)   Sepsis 2/2: Pyelonephritis Patient hemodynamically stable Given Rocephin emergency room, will continue Urine culture pending Blood cultures 2 pending Patient given 3000 mL of fluid in the emergency room Lactic acid normal now    Code Status: FC  DVT Prophylaxis: lovenox Family Communication: father Disposition Plan: Pending Improvement  Status: obs tele  Haydee Salter, MD Family Medicine [facility] [email or web address] Password [email or web address]

## 2016-11-07 NOTE — ED Triage Notes (Addendum)
Per [facility] pt from home c/o upper back pain onset of 3 am, woke pt up from sleep. Describes pain as sharp and intermittent. Took tylenol and aspirin at 5am with not much relief. C/o chills and feeling anxious.  Patient adds Friday of last week she had dark and cloudy urine with some flank pain.

## 2016-11-07 NOTE — ED Notes (Signed)
Hospitalist at bedside 

## 2016-11-07 NOTE — ED Notes (Signed)
RN AND MD NOTIFIED OF PATIENT'S LACTIC ACID LEVEL OF 2.54

## 2016-11-08 DIAGNOSIS — M5489 Other dorsalgia: Secondary | ICD-10-CM | POA: Diagnosis present

## 2016-11-08 DIAGNOSIS — N12 Tubulo-interstitial nephritis, not specified as acute or chronic: Secondary | ICD-10-CM | POA: Diagnosis present

## 2016-11-08 DIAGNOSIS — D638 Anemia in other chronic diseases classified elsewhere: Secondary | ICD-10-CM | POA: Diagnosis present

## 2016-11-08 DIAGNOSIS — A4151 Sepsis due to Escherichia coli [E. coli]: Secondary | ICD-10-CM | POA: Diagnosis not present

## 2016-11-08 DIAGNOSIS — A419 Sepsis, unspecified organism: Secondary | ICD-10-CM

## 2016-11-08 LAB — CBC
HEMATOCRIT: 26 % — AB (ref 36.0–46.0)
HEMOGLOBIN: 8.7 g/dL — AB (ref 12.0–15.0)
MCH: 29.8 pg (ref 26.0–34.0)
MCHC: 33.5 g/dL (ref 30.0–36.0)
MCV: 89 fL (ref 78.0–100.0)
Platelets: 187 10*3/uL (ref 150–400)
RBC: 2.92 MIL/uL — AB (ref 3.87–5.11)
RDW: 13 % (ref 11.5–15.5)
WBC: 13.3 10*3/uL — ABNORMAL HIGH (ref 4.0–10.5)

## 2016-11-08 LAB — BLOOD CULTURE ID PANEL (REFLEXED)
ACINETOBACTER BAUMANNII: NOT DETECTED
CANDIDA ALBICANS: NOT DETECTED
CANDIDA PARAPSILOSIS: NOT DETECTED
CANDIDA TROPICALIS: NOT DETECTED
CARBAPENEM RESISTANCE: NOT DETECTED
Candida glabrata: NOT DETECTED
Candida krusei: NOT DETECTED
ENTEROBACTERIACEAE SPECIES: DETECTED — AB
ESCHERICHIA COLI: DETECTED — AB
Enterobacter cloacae complex: NOT DETECTED
Enterococcus species: NOT DETECTED
HAEMOPHILUS INFLUENZAE: NOT DETECTED
KLEBSIELLA OXYTOCA: NOT DETECTED
KLEBSIELLA PNEUMONIAE: NOT DETECTED
Listeria monocytogenes: NOT DETECTED
Neisseria meningitidis: NOT DETECTED
PROTEUS SPECIES: NOT DETECTED
Pseudomonas aeruginosa: NOT DETECTED
SERRATIA MARCESCENS: NOT DETECTED
STAPHYLOCOCCUS AUREUS BCID: NOT DETECTED
STAPHYLOCOCCUS SPECIES: NOT DETECTED
STREPTOCOCCUS SPECIES: NOT DETECTED
Streptococcus agalactiae: NOT DETECTED
Streptococcus pneumoniae: NOT DETECTED
Streptococcus pyogenes: NOT DETECTED

## 2016-11-08 LAB — BASIC METABOLIC PANEL
ANION GAP: 7 (ref 5–15)
BUN: 7 mg/dL (ref 6–20)
CHLORIDE: 112 mmol/L — AB (ref 101–111)
CO2: 20 mmol/L — AB (ref 22–32)
Calcium: 7.8 mg/dL — ABNORMAL LOW (ref 8.9–10.3)
Creatinine, Ser: 0.71 mg/dL (ref 0.44–1.00)
GFR calc non Af Amer: >60 mL/min (ref >60)
GLUCOSE: 94 mg/dL (ref 65–99)
POTASSIUM: 3.6 mmol/L (ref 3.5–5.1)
Sodium: 139 mmol/L (ref 135–145)

## 2016-11-08 MED ORDER — FERROUS SULFATE 325 (65 FE) MG PO TABS
325.0000 mg | ORAL_TABLET | Freq: Three times a day (TID) | ORAL | Status: DC
  Administered 2016-11-08 – 2016-11-10 (×6): 325 mg via ORAL
  Filled 2016-11-08 (×6): qty 1

## 2016-11-08 NOTE — Progress Notes (Signed)
PHARMACY - PHYSICIAN COMMUNICATION CRITICAL VALUE ALERT - BLOOD CULTURE IDENTIFICATION (BCID)  Results for orders placed or performed during the hospital encounter of 11/07/16  Blood Culture ID Panel (Reflexed) (Collected: 11/07/2016  9:58 AM)  Result Value Ref Range   Enterococcus species NOT DETECTED NOT DETECTED   Listeria monocytogenes NOT DETECTED NOT DETECTED   Staphylococcus species NOT DETECTED NOT DETECTED   Staphylococcus aureus NOT DETECTED NOT DETECTED   Streptococcus species NOT DETECTED NOT DETECTED   Streptococcus agalactiae NOT DETECTED NOT DETECTED   Streptococcus pneumoniae NOT DETECTED NOT DETECTED   Streptococcus pyogenes NOT DETECTED NOT DETECTED   Acinetobacter baumannii NOT DETECTED NOT DETECTED   Enterobacteriaceae species DETECTED (A) NOT DETECTED   Enterobacter cloacae complex NOT DETECTED NOT DETECTED   Escherichia coli DETECTED (A) NOT DETECTED   Klebsiella oxytoca NOT DETECTED NOT DETECTED   Klebsiella pneumoniae NOT DETECTED NOT DETECTED   Proteus species NOT DETECTED NOT DETECTED   Serratia marcescens NOT DETECTED NOT DETECTED   Carbapenem resistance NOT DETECTED NOT DETECTED   Haemophilus influenzae NOT DETECTED NOT DETECTED   Neisseria meningitidis NOT DETECTED NOT DETECTED   Pseudomonas aeruginosa NOT DETECTED NOT DETECTED   Candida albicans NOT DETECTED NOT DETECTED   Candida glabrata NOT DETECTED NOT DETECTED   Candida krusei NOT DETECTED NOT DETECTED   Candida parapsilosis NOT DETECTED NOT DETECTED   Candida tropicalis NOT DETECTED NOT DETECTED    Name of physician (or Provider) Contacted: X. Blount, NP  Changes to prescribed antibiotics required: Patient on appropriate therapy of Ceftriaxone 2gm IV q24h  Maryellen PilePoindexter, Anne Boltz Trefz, PharmD 11/08/2016  6:15 AM

## 2016-11-08 NOTE — Progress Notes (Signed)
PROGRESS NOTE    Cheryl Greene  YNW:[id number] DOB: Jul 13, 1990 DOA: 11/07/2016 PCP: Patient, No Pcp Per    Brief Narrative: Cheryl Greene is a 26 y.o. female with no significant past medical history presents to the emergency room with back pain.   Assessment & Plan:   Active Problems:   Sepsis (HCC)   Sepsis from pyelonephritis/ E coli bacteremia:  On IV rocephin.  Blood cultures show e coli , sensitivities pending  Hydration   lactic acid Normalized    Anemia of chronic disease: Hemoglobin around 8.7.  Anemia panel ordered.    DVT prophylaxis: lovenox.  Code Status: full code.  Family Communication: family at bedside.  Disposition Plan: pending further eval.   Consultants:   None.    Procedures: none.    Antimicrobials: rocephin since admission.    Subjective: Pain well controlled.   Objective: Vitals:   11/07/16 1605 11/07/16 2107 11/08/16 0626 11/08/16 1429  BP: 118/70 113/74 127/78 131/85  Pulse: (!) 101 84 88 87  Resp: 18 20 18 18   Temp: 99.3 F (37.4 C) 98.2 F (36.8 C) 97.7 F (36.5 C) 99 F (37.2 C)  TempSrc: Oral Oral Oral Oral  SpO2: 98% 100% 100% 100%  Weight:   102.7 kg (226 lb 8 oz)   Height:        Intake/Output Summary (Last 24 hours) at 11/08/16 1735 Last data filed at 11/08/16 0957  Gross per 24 hour  Intake             1250 ml  Output             1800 ml  Net             -550 ml   Filed Weights   11/07/16 0855 11/08/16 0626  Weight: 97.5 kg (215 lb) 102.7 kg (226 lb 8 oz)    Examination:  General exam: Appears calm and comfortable  Respiratory system: Clear to auscultation. Respiratory effort normal. Cardiovascular system: S1 & S2 heard, RRR. No JVD, murmurs, rubs, gallops or clicks. No pedal edema. Gastrointestinal system: Abdomen is nondistended, soft and nontender. No organomegaly or masses felt. Normal bowel sounds heard. Central nervous system: Alert and oriented. No focal neurological  deficits. Extremities: Symmetric 5 x 5 power. Skin: No rashes, lesions or ulcers Psychiatry: Judgement and insight appear normal. Mood & affect appropriate.     Data Reviewed: I have personally reviewed following labs and imaging studies  CBC:  Recent Labs Lab 11/07/16 0903 11/08/16 0517  WBC 12.5* 13.3*  NEUTROABS 11.2*  --   HGB 10.5* 8.7*  HCT 31.3* 26.0*  MCV 87.7 89.0  PLT 229 187   Basic Metabolic Panel:  Recent Labs Lab 11/07/16 0903 11/08/16 0517  NA 135 139  K 3.3* 3.6  CL 106 112*  CO2 18* 20*  GLUCOSE 136* 94  BUN 9 7  CREATININE 0.78 0.71  CALCIUM 8.8* 7.8*   GFR: Estimated Creatinine Clearance: 127.9 mL/min (by C-G formula based on SCr of 0.71 mg/dL). Liver Function Tests:  Recent Labs Lab 11/07/16 0903  AST 39  ALT 42  ALKPHOS 57  BILITOT 0.9  PROT 7.2  ALBUMIN 3.6   No results for input(s): LIPASE, AMYLASE in the last 168 hours. No results for input(s): AMMONIA in the last 168 hours. Coagulation Profile: No results for input(s): INR, PROTIME in the last 168 hours. Cardiac Enzymes: No results for input(s): CKTOTAL, CKMB, CKMBINDEX, TROPONINI in the last 168 hours. BNP (last 3  results) No results for input(s): PROBNP in the last 8760 hours. HbA1C: No results for input(s): HGBA1C in the last 72 hours. CBG: No results for input(s): GLUCAP in the last 168 hours. Lipid Profile: No results for input(s): CHOL, HDL, LDLCALC, TRIG, CHOLHDL, LDLDIRECT in the last 72 hours. Thyroid Function Tests: No results for input(s): TSH, T4TOTAL, FREET4, T3FREE, THYROIDAB in the last 72 hours. Anemia Panel: No results for input(s): VITAMINB12, FOLATE, FERRITIN, TIBC, IRON, RETICCTPCT in the last 72 hours. Sepsis Labs:  Recent Labs Lab 11/07/16 0913 11/07/16 1209  LATICACIDVEN 2.54* 1.79    Recent Results (from the past 240 hour(s))  Urine Culture     Status: Abnormal (Preliminary result)   Collection Time: 11/07/16  8:58 AM  Result Value Ref  Range Status   Specimen Description URINE, CLEAN CATCH  Final   Special Requests NONE  Final   Culture >=100,000 COLONIES/mL ESCHERICHIA COLI (A)  Final   Report Status PENDING  Incomplete  Culture, blood (routine x 2)     Status: None (Preliminary result)   Collection Time: 11/07/16  9:08 AM  Result Value Ref Range Status   Specimen Description BLOOD LEFT HAND  Final   Special Requests   Final    BOTTLES DRAWN AEROBIC AND ANAEROBIC Blood Culture adequate volume   Culture   Final    NO GROWTH < 24 HOURS Performed at [facility], [address] [place], [place] [address]    Report Status PENDING  Incomplete  Culture, blood (routine x 2)     Status: Abnormal (Preliminary result)   Collection Time: 11/07/16  9:58 AM  Result Value Ref Range Status   Specimen Description BLOOD RIGHT ANTECUBITAL  Final   Special Requests   Final    BOTTLES DRAWN AEROBIC AND ANAEROBIC Blood Culture adequate volume   Culture  Setup Time   Final    GRAM NEGATIVE RODS ANAEROBIC BOTTLE ONLY CRITICAL RESULT CALLED TO, READ BACK BY AND VERIFIED WITH: TO LPOINTDEXTER(PHARMd) BY TCLEVELAND 11/08/2016 AT 5:58AM    Culture (A)  Final    ESCHERICHIA COLI SUSCEPTIBILITIES TO FOLLOW Performed at [facility], [address] [place], [place] [address]    Report Status PENDING  Incomplete  Blood Culture ID Panel (Reflexed)     Status: Abnormal   Collection Time: 11/07/16  9:58 AM  Result Value Ref Range Status   Enterococcus species NOT DETECTED NOT DETECTED Final   Listeria monocytogenes NOT DETECTED NOT DETECTED Final   Staphylococcus species NOT DETECTED NOT DETECTED Final   Staphylococcus aureus NOT DETECTED NOT DETECTED Final   Streptococcus species NOT DETECTED NOT DETECTED Final   Streptococcus agalactiae NOT DETECTED NOT DETECTED Final   Streptococcus pneumoniae NOT DETECTED NOT DETECTED Final   Streptococcus pyogenes NOT DETECTED NOT DETECTED Final   Acinetobacter baumannii NOT DETECTED  NOT DETECTED Final   Enterobacteriaceae species DETECTED (A) NOT DETECTED Final    Comment: Enterobacteriaceae represent a large family of gram-negative bacteria, not a single organism. CRITICAL RESULT CALLED TO, READ BACK BY AND VERIFIED WITH: TO LPOINTDEXTER(PHARMd) BY TCLEVELAND 11/08/2016 AT 5:58AM    Enterobacter cloacae complex NOT DETECTED NOT DETECTED Final   Escherichia coli DETECTED (A) NOT DETECTED Final    Comment: CRITICAL RESULT CALLED TO, READ BACK BY AND VERIFIED WITH: TO LPOINTDEXTER(PHARMd) BY TCLEVELAND 11/08/2016 AT 5:58AM    Klebsiella oxytoca NOT DETECTED NOT DETECTED Final   Klebsiella pneumoniae NOT DETECTED NOT DETECTED Final   Proteus species NOT  DETECTED NOT DETECTED Final   Serratia marcescens NOT DETECTED NOT DETECTED Final   Carbapenem resistance NOT DETECTED NOT DETECTED Final   Haemophilus influenzae NOT DETECTED NOT DETECTED Final   Neisseria meningitidis NOT DETECTED NOT DETECTED Final   Pseudomonas aeruginosa NOT DETECTED NOT DETECTED Final   Candida albicans NOT DETECTED NOT DETECTED Final   Candida glabrata NOT DETECTED NOT DETECTED Final   Candida krusei NOT DETECTED NOT DETECTED Final   Candida parapsilosis NOT DETECTED NOT DETECTED Final   Candida tropicalis NOT DETECTED NOT DETECTED Final    Comment: Performed at [facility], [address], [place], [place] [address]         Radiology Studies: Dg Chest 2 View  Result Date: 11/07/2016 CLINICAL DATA:  Upper back pain, shortness of Breath EXAM: CHEST  2 VIEW COMPARISON:  None. FINDINGS: Heart and mediastinal contours are within normal limits. No focal opacities or effusions. No acute bony abnormality. IMPRESSION: No active cardiopulmonary disease. Electronically Signed   By: Charlett Nose M.D.   On: 11/07/2016 10:43   Ct Renal Stone Study  Result Date: 11/07/2016 CLINICAL DATA:  Back pain for several hours EXAM: CT ABDOMEN AND PELVIS WITHOUT CONTRAST TECHNIQUE: Multidetector CT  imaging of the abdomen and pelvis was performed following the standard protocol without IV contrast. COMPARISON:  None. FINDINGS: Lower chest: No acute abnormality. Hepatobiliary: No focal liver abnormality is seen. No gallstones, gallbladder wall thickening, or biliary dilatation. Pancreas: Unremarkable. No pancreatic ductal dilatation or surrounding inflammatory changes. Spleen: Normal in size without focal abnormality. Adrenals/Urinary Tract: Adrenal glands are unremarkable. Kidneys are normal, without renal calculi, focal lesion, or hydronephrosis. Bladder is unremarkable. Stomach/Bowel: The appendix is within normal limits. No obstructive or inflammatory changes are noted. Vascular/Lymphatic: No significant vascular findings are present. No enlarged abdominal or pelvic lymph nodes. Reproductive: Uterus and bilateral adnexa are unremarkable. Other: No abdominal wall hernia or abnormality. No abdominopelvic ascites. Musculoskeletal: No acute bony abnormality is noted. A sclerotic focus is noted in the right iliac bone recent to the sacroiliac joint likely related to underlying degenerative change. Mild spurring at the sacroiliac joint is noted. IMPRESSION: No acute abnormality is noted. Electronically Signed   By: Alcide Clever M.D.   On: 11/07/2016 13:42        Scheduled Meds: . enoxaparin (LOVENOX) injection  40 mg Subcutaneous Q24H  . ferrous sulfate  325 mg Oral TID WC  . norgestrel-ethinyl estradiol  1 tablet Oral Daily   Continuous Infusions: . cefTRIAXone (ROCEPHIN)  IV Stopped (11/08/16 0957)     LOS: 0 days    Time spent: 35 minutes.     Kathlen Mody, MD [facility] Pager [phone]  If 7PM-7AM, please contact night-coverage [email or web address] Password [email or web address] 11/08/2016, 5:35 PM

## 2016-11-09 DIAGNOSIS — A4151 Sepsis due to Escherichia coli [E. coli]: Principal | ICD-10-CM

## 2016-11-09 LAB — RETICULOCYTES
RBC.: 3.46 MIL/uL — ABNORMAL LOW (ref 3.87–5.11)
RETIC CT PCT: 2.1 % (ref 0.4–3.1)
Retic Count, Absolute: 72.7 10*3/uL (ref 19.0–186.0)

## 2016-11-09 LAB — CBC
HEMATOCRIT: 30.7 % — AB (ref 36.0–46.0)
HEMOGLOBIN: 10 g/dL — AB (ref 12.0–15.0)
MCH: 28.9 pg (ref 26.0–34.0)
MCHC: 32.6 g/dL (ref 30.0–36.0)
MCV: 88.7 fL (ref 78.0–100.0)
Platelets: 243 10*3/uL (ref 150–400)
RBC: 3.46 MIL/uL — AB (ref 3.87–5.11)
RDW: 12.9 % (ref 11.5–15.5)
WBC: 9.2 10*3/uL (ref 4.0–10.5)

## 2016-11-09 LAB — CULTURE, BLOOD (ROUTINE X 2): SPECIAL REQUESTS: ADEQUATE

## 2016-11-09 LAB — URINE CULTURE: Culture: >=100000 — AB

## 2016-11-09 LAB — FOLATE: Folate: 12.8 ng/mL (ref >5.9)

## 2016-11-09 LAB — VITAMIN B12: VITAMIN B 12: 229 pg/mL (ref 180–914)

## 2016-11-09 LAB — IRON AND TIBC
Iron: 256 ug/dL — ABNORMAL HIGH (ref 28–170)
SATURATION RATIOS: 69 % — AB (ref 10.4–31.8)
TIBC: 372 ug/dL (ref 250–450)
UIBC: 116 ug/dL

## 2016-11-09 LAB — HIV ANTIBODY (ROUTINE TESTING W REFLEX): HIV Screen 4th Generation wRfx: NONREACTIVE

## 2016-11-09 LAB — FERRITIN: FERRITIN: 25 ng/mL (ref 11–307)

## 2016-11-09 NOTE — Progress Notes (Signed)
PROGRESS NOTE    Cheryl Greene  ZOX:[id number] DOB: Oct 03, 1990 DOA: 11/07/2016 PCP: Patient, No Pcp Per    Brief Narrative: Cheryl Greene is a 26 y.o. female with no significant past medical history presents to the emergency room with back pain.   Assessment & Plan:   Active Problems:   Sepsis (HCC)   Sepsis from pyelonephritis/ E coli bacteremia:  On IV rocephin.  Blood cultures show e coli pan sensitive. Will discuss with ID and change to oral in am for discharge. She will probably need atleast 2 weeks of antibiotics.  Repeat blood cultures ordered.  Hydration   lactic acid Normalized    Anemia of chronic disease: Hemoglobin around 8.7.  Anemia panel ordered.   Leukocytosis probably from the infection.    DVT prophylaxis: lovenox.  Code Status: full code.  Family Communication: family at bedside.  Disposition Plan: home in am. If blood cultures are negative.   Consultants:   None.    Procedures: none.    Antimicrobials: rocephin since admission.    Subjective: Pain well controlled. No nausea or vomiting.  She reports feeling weak.   Objective: Vitals:   11/08/16 1429 11/08/16 2059 11/09/16 0540 11/09/16 0900  BP: 131/85 130/86 115/62   Pulse: 87 87 82   Resp: 18 18 18    Temp: 99 F (37.2 C) 98.4 F (36.9 C) 99.1 F (37.3 C)   TempSrc: Oral Oral Oral   SpO2: 100% 100% 99% 97%  Weight:   98.2 kg (216 lb 6.4 oz)   Height:        Intake/Output Summary (Last 24 hours) at 11/09/16 1503 Last data filed at 11/09/16 0600  Gross per 24 hour  Intake             1080 ml  Output             2200 ml  Net            -1120 ml   Filed Weights   11/07/16 0855 11/08/16 0626 11/09/16 0540  Weight: 97.5 kg (215 lb) 102.7 kg (226 lb 8 oz) 98.2 kg (216 lb 6.4 oz)    Examination:  General exam: Appears calm and comfortable not in distress.  Respiratory system: Clear to auscultation. Respiratory effort normal. No wheezing or rhonchi.  Cardiovascular  system: S1 & S2 heard, RRR. No JVD, murmurs,  No pedal edema. Gastrointestinal system: Abdomen is soft NT ND BS+ Central nervous system: Alert and oriented. No focal neurological deficits. Extremities: Symmetric 5 x 5 power. No cyanosis or clubbing.  Skin: No rashes, lesions or ulcers Psychiatry: . Mood & affect appropriate.     Data Reviewed: I have personally reviewed following labs and imaging studies  CBC:  Recent Labs Lab 11/07/16 0903 11/08/16 0517  WBC 12.5* 13.3*  NEUTROABS 11.2*  --   HGB 10.5* 8.7*  HCT 31.3* 26.0*  MCV 87.7 89.0  PLT 229 187   Basic Metabolic Panel:  Recent Labs Lab 11/07/16 0903 11/08/16 0517  NA 135 139  K 3.3* 3.6  CL 106 112*  CO2 18* 20*  GLUCOSE 136* 94  BUN 9 7  CREATININE 0.78 0.71  CALCIUM 8.8* 7.8*   GFR: Estimated Creatinine Clearance: 124.9 mL/min (by C-G formula based on SCr of 0.71 mg/dL). Liver Function Tests:  Recent Labs Lab 11/07/16 0903  AST 39  ALT 42  ALKPHOS 57  BILITOT 0.9  PROT 7.2  ALBUMIN 3.6   No results for input(s): LIPASE, AMYLASE  in the last 168 hours. No results for input(s): AMMONIA in the last 168 hours. Coagulation Profile: No results for input(s): INR, PROTIME in the last 168 hours. Cardiac Enzymes: No results for input(s): CKTOTAL, CKMB, CKMBINDEX, TROPONINI in the last 168 hours. BNP (last 3 results) No results for input(s): PROBNP in the last 8760 hours. HbA1C: No results for input(s): HGBA1C in the last 72 hours. CBG: No results for input(s): GLUCAP in the last 168 hours. Lipid Profile: No results for input(s): CHOL, HDL, LDLCALC, TRIG, CHOLHDL, LDLDIRECT in the last 72 hours. Thyroid Function Tests: No results for input(s): TSH, T4TOTAL, FREET4, T3FREE, THYROIDAB in the last 72 hours. Anemia Panel: No results for input(s): VITAMINB12, FOLATE, FERRITIN, TIBC, IRON, RETICCTPCT in the last 72 hours. Sepsis Labs:  Recent Labs Lab 11/07/16 0913 11/07/16 1209  LATICACIDVEN 2.54*  1.79    Recent Results (from the past 240 hour(s))  Urine Culture     Status: Abnormal   Collection Time: 11/07/16  8:58 AM  Result Value Ref Range Status   Specimen Description URINE, CLEAN CATCH  Final   Special Requests NONE  Final   Culture >=100,000 COLONIES/mL ESCHERICHIA COLI (A)  Final   Report Status 11/09/2016 FINAL  Final   Organism ID, Bacteria ESCHERICHIA COLI (A)  Final      Susceptibility   Escherichia coli - MIC*    AMPICILLIN <=2 SENSITIVE Sensitive     CEFAZOLIN <=4 SENSITIVE Sensitive     CEFTRIAXONE <=1 SENSITIVE Sensitive     CIPROFLOXACIN <=0.25 SENSITIVE Sensitive     GENTAMICIN <=1 SENSITIVE Sensitive     IMIPENEM <=0.25 SENSITIVE Sensitive     NITROFURANTOIN <=16 SENSITIVE Sensitive     TRIMETH/SULFA <=20 SENSITIVE Sensitive     AMPICILLIN/SULBACTAM <=2 SENSITIVE Sensitive     PIP/TAZO <=4 SENSITIVE Sensitive     Extended ESBL NEGATIVE Sensitive     * >=100,000 COLONIES/mL ESCHERICHIA COLI  Culture, blood (routine x 2)     Status: None (Preliminary result)   Collection Time: 11/07/16  9:08 AM  Result Value Ref Range Status   Specimen Description BLOOD LEFT HAND  Final   Special Requests   Final    BOTTLES DRAWN AEROBIC AND ANAEROBIC Blood Culture adequate volume   Culture   Final    NO GROWTH < 24 HOURS Performed at [facility], [address], [place], [place] [address]    Report Status PENDING  Incomplete  Culture, blood (routine x 2)     Status: Abnormal   Collection Time: 11/07/16  9:58 AM  Result Value Ref Range Status   Specimen Description BLOOD RIGHT ANTECUBITAL  Final   Special Requests   Final    BOTTLES DRAWN AEROBIC AND ANAEROBIC Blood Culture adequate volume   Culture  Setup Time   Final    GRAM NEGATIVE RODS ANAEROBIC BOTTLE ONLY CRITICAL RESULT CALLED TO, READ BACK BY AND VERIFIED WITH: TO LPOINTDEXTER(PHARMd) BY TCLEVELAND 11/08/2016 AT 5:58AM Performed at [facility], [address] [place], [place] [address]     Culture ESCHERICHIA COLI (A)  Final   Report Status 11/09/2016 FINAL  Final   Organism ID, Bacteria ESCHERICHIA COLI  Final      Susceptibility   Escherichia coli - MIC*    AMPICILLIN <=2 SENSITIVE Sensitive     CEFAZOLIN <=4 SENSITIVE Sensitive     CEFEPIME <=1 SENSITIVE Sensitive     CEFTAZIDIME <=1 SENSITIVE Sensitive     CEFTRIAXONE <=1 SENSITIVE  Sensitive     CIPROFLOXACIN <=0.25 SENSITIVE Sensitive     GENTAMICIN <=1 SENSITIVE Sensitive     IMIPENEM <=0.25 SENSITIVE Sensitive     TRIMETH/SULFA <=20 SENSITIVE Sensitive     AMPICILLIN/SULBACTAM <=2 SENSITIVE Sensitive     PIP/TAZO <=4 SENSITIVE Sensitive     Extended ESBL NEGATIVE Sensitive     * ESCHERICHIA COLI  Blood Culture ID Panel (Reflexed)     Status: Abnormal   Collection Time: 11/07/16  9:58 AM  Result Value Ref Range Status   Enterococcus species NOT DETECTED NOT DETECTED Final   Listeria monocytogenes NOT DETECTED NOT DETECTED Final   Staphylococcus species NOT DETECTED NOT DETECTED Final   Staphylococcus aureus NOT DETECTED NOT DETECTED Final   Streptococcus species NOT DETECTED NOT DETECTED Final   Streptococcus agalactiae NOT DETECTED NOT DETECTED Final   Streptococcus pneumoniae NOT DETECTED NOT DETECTED Final   Streptococcus pyogenes NOT DETECTED NOT DETECTED Final   Acinetobacter baumannii NOT DETECTED NOT DETECTED Final   Enterobacteriaceae species DETECTED (A) NOT DETECTED Final    Comment: Enterobacteriaceae represent a large family of gram-negative bacteria, not a single organism. CRITICAL RESULT CALLED TO, READ BACK BY AND VERIFIED WITH: TO LPOINTDEXTER(PHARMd) BY TCLEVELAND 11/08/2016 AT 5:58AM    Enterobacter cloacae complex NOT DETECTED NOT DETECTED Final   Escherichia coli DETECTED (A) NOT DETECTED Final    Comment: CRITICAL RESULT CALLED TO, READ BACK BY AND VERIFIED WITH: TO LPOINTDEXTER(PHARMd) BY TCLEVELAND 11/08/2016 AT 5:58AM    Klebsiella oxytoca NOT DETECTED NOT DETECTED Final    Klebsiella pneumoniae NOT DETECTED NOT DETECTED Final   Proteus species NOT DETECTED NOT DETECTED Final   Serratia marcescens NOT DETECTED NOT DETECTED Final   Carbapenem resistance NOT DETECTED NOT DETECTED Final   Haemophilus influenzae NOT DETECTED NOT DETECTED Final   Neisseria meningitidis NOT DETECTED NOT DETECTED Final   Pseudomonas aeruginosa NOT DETECTED NOT DETECTED Final   Candida albicans NOT DETECTED NOT DETECTED Final   Candida glabrata NOT DETECTED NOT DETECTED Final   Candida krusei NOT DETECTED NOT DETECTED Final   Candida parapsilosis NOT DETECTED NOT DETECTED Final   Candida tropicalis NOT DETECTED NOT DETECTED Final    Comment: Performed at [facility], [address] [place], [place] [address]         Radiology Studies: No results found.      Scheduled Meds: . enoxaparin (LOVENOX) injection  40 mg Subcutaneous Q24H  . ferrous sulfate  325 mg Oral TID WC  . norgestrel-ethinyl estradiol  1 tablet Oral Daily   Continuous Infusions: . cefTRIAXone (ROCEPHIN)  IV Stopped (11/09/16 1021)     LOS: 1 day    Time spent: 35 minutes.     Kathlen ModyAKULA,Sulema Braid, MD [facility] Pager [phone]  If 7PM-7AM, please contact night-coverage [email or web address] Password [email or web address] 11/09/2016, 3:03 PM

## 2016-11-10 MED ORDER — CIPROFLOXACIN HCL 500 MG PO TABS: 500.0000 mg | ORAL_TABLET | Freq: Two times a day (BID) | ORAL | 0 refills | Status: AC

## 2016-11-10 NOTE — Care Management Note (Signed)
Case Management Note  Patient Details  Name: Laretta AlstromDenechea Huot MRN: [id number] Date of Birth: 07/29/1990  Subjective/Objective:26 y/o f admitted w/Sepsis. From home. No pcp-CM referral for pcp-provided patient w/pcp listing-encouraged [facility], informed of ability to get meds there also, patient able to make own appt-patient voiced understanding. Provided w[facility] med list also. No further CM needs.                    Action/Plan:d/c home.   Expected Discharge Date:  11/10/16               Expected Discharge Plan:  Home/Self Care  In-House Referral:     Discharge planning Services  CM Consult, [facility]igent [facility]  Post Acute Care Choice:    Choice offered to:     DME Arranged:    DME Agency:     HH Arranged:    HH Agency:     Status of Service:  Completed, signed off  If discussed at [employer] Meetings, dates discussed:    Additional Comments:  Lanier ClamMahabir, Sekai Nayak, RN 11/10/2016, 10:14 AM

## 2016-11-12 LAB — CULTURE, BLOOD (ROUTINE X 2)
Culture: NO GROWTH
Special Requests: ADEQUATE

## 2016-11-14 LAB — CULTURE, BLOOD (ROUTINE X 2)
Culture: NO GROWTH
Culture: NO GROWTH
Special Requests: ADEQUATE

## 2016-11-17 NOTE — Discharge Summary (Signed)
Physician Discharge Summary  Cheryl Greene ZOX:[id number] DOB: May 16, 1990 DOA: 11/07/2016  PCP: Patient, No Pcp Per  Admit date: 11/07/2016 Discharge date: 11/08/2016  Admitted From: Home.  Disposition:  Home.   Recommendations for Outpatient Follow-up:  1. Follow up with PCP in 1-2 weeks 2. Please obtain BMP/CBC in one week   Discharge Condition:stable.  CODE STATUS: full code.  Diet recommendation: Heart Healthy   Brief/Interim Summary: Cheryl Greene a 26 y.o.femalewith no significant past medical history presents to the emergency room with back pain.     Discharge Diagnoses:  Sepsis  Pyelonephritis.  Anemia   Sepsis from pyelonephritis/ E coli bacteremia:  On IV rocephin.  Blood cultures show e coli pan sensitive. Discharged her on 2 weeks of antibiotic to complete the course.  Repeat blood cultures ordered.  Hydration   lactic acid Normalized    Anemia of chronic disease: Hemoglobin around 8.7.    Leukocytosis probably from the infection.     Discharge Instructions  Discharge Instructions    Diet general   Complete by:  As directed    Discharge instructions   Complete by:  As directed    Please follow up with PCP in one week.     Allergies as of 11/10/2016   No Known Allergies     Medication List    STOP taking these medications   naproxen sodium 220 MG tablet Commonly known as:  ALEVE     TAKE these medications   acetaminophen 500 MG tablet Commonly known as:  TYLENOL Take 1,000 mg by mouth every 6 (six) hours as needed for mild pain.   ciprofloxacin 500 MG tablet Commonly known as:  CIPRO Take 1 tablet (500 mg total) by mouth 2 (two) times daily.   ferrous sulfate 325 (65 FE) MG EC tablet Take 1 tablet (325 mg total) by mouth 3 (three) times daily with meals.   multivitamin with minerals Tabs tablet Take 2 tablets by mouth daily.   norgestrel-ethinyl estradiol 0.3-30 MG-MCG tablet Commonly known as:   LO/OVRAL,CRYSELLE Take 1 tablet by mouth daily.      Follow-up Information    [facility]. Schedule an appointment as soon as possible for a visit.   Contact information: [address] [place] [address] [phone]         No Known Allergies  Consultations:  None.    Procedures/Studies: Dg Chest 2 View  Result Date: 11/07/2016 CLINICAL DATA:  Upper back pain, shortness of Breath EXAM: CHEST  2 VIEW COMPARISON:  None. FINDINGS: Heart and mediastinal contours are within normal limits. No focal opacities or effusions. No acute bony abnormality. IMPRESSION: No active cardiopulmonary disease. Electronically Signed   By: Charlett Nose M.D.   On: 11/07/2016 10:43   Ct Renal Stone Study  Result Date: 11/07/2016 CLINICAL DATA:  Back pain for several hours EXAM: CT ABDOMEN AND PELVIS WITHOUT CONTRAST TECHNIQUE: Multidetector CT imaging of the abdomen and pelvis was performed following the standard protocol without IV contrast. COMPARISON:  None. FINDINGS: Lower chest: No acute abnormality. Hepatobiliary: No focal liver abnormality is seen. No gallstones, gallbladder wall thickening, or biliary dilatation. Pancreas: Unremarkable. No pancreatic ductal dilatation or surrounding inflammatory changes. Spleen: Normal in size without focal abnormality. Adrenals/Urinary Tract: Adrenal glands are unremarkable. Kidneys are normal, without renal calculi, focal lesion, or hydronephrosis. Bladder is unremarkable. Stomach/Bowel: The appendix is within normal limits. No obstructive or inflammatory changes are noted. Vascular/Lymphatic: No significant vascular findings are present.  No enlarged abdominal or pelvic lymph nodes. Reproductive: Uterus and bilateral adnexa are unremarkable. Other: No abdominal wall hernia or abnormality. No abdominopelvic ascites. Musculoskeletal: No acute bony abnormality is noted. A sclerotic focus is noted in the right iliac  bone recent to the sacroiliac joint likely related to underlying degenerative change. Mild spurring at the sacroiliac joint is noted. IMPRESSION: No acute abnormality is noted. Electronically Signed   By: Alcide Clever M.D.   On: 11/07/2016 13:42       Subjective: Pain controlled. No new complaints.   Discharge Exam: Vitals:   11/09/16 2047 11/10/16 0534  BP: 129/76 118/74  Pulse: 72 66  Resp: 17 18  Temp: 99 F (37.2 C) 98.9 F (37.2 C)  SpO2: 99% 100%   Vitals:   11/09/16 1545 11/09/16 2047 11/10/16 0534 11/10/16 0657  BP: (!) 154/80 129/76 118/74   Pulse: 78 72 66   Resp: 18 17 18    Temp: 98.5 F (36.9 C) 99 F (37.2 C) 98.9 F (37.2 C)   TempSrc: Oral Oral Oral   SpO2: 97% 99% 100%   Weight:    96.2 kg (212 lb 1.3 oz)  Height:        General: Pt is alert, awake, not in acute distress Cardiovascular: RRR, S1/S2 +, no rubs, no gallops Respiratory: CTA bilaterally, no wheezing, no rhonchi Abdominal: Soft, NT, ND, bowel sounds + Extremities: no edema, no cyanosis    The results of significant diagnostics from this hospitalization (including imaging, microbiology, ancillary and laboratory) are listed below for reference.     Microbiology: Recent Results (from the past 240 hour(s))  Culture, blood (Routine X 2) w Reflex to ID Panel     Status: None   Collection Time: 11/09/16  9:53 AM  Result Value Ref Range Status   Specimen Description BLOOD RIGHT ANTECUBITAL  Final   Special Requests   Final    BOTTLES DRAWN AEROBIC AND ANAEROBIC Blood Culture results may not be optimal due to an inadequate volume of blood received in culture bottles   Culture   Final    NO GROWTH 5 DAYS Performed at [facility], [address] [place], [place] [address]    Report Status 11/14/2016 FINAL  Final  Culture, blood (Routine X 2) w Reflex to ID Panel     Status: None   Collection Time: 11/09/16  9:57 AM  Result Value Ref Range Status   Specimen Description BLOOD LEFT  ANTECUBITAL  Final   Special Requests   Final    BOTTLES DRAWN AEROBIC AND ANAEROBIC Blood Culture adequate volume   Culture   Final    NO GROWTH 5 DAYS Performed at [facility], [address] [place], [place] [address]    Report Status 11/14/2016 FINAL  Final     Labs: BNP (last 3 results) No results for input(s): BNP in the last 8760 hours. Basic Metabolic Panel: No results for input(s): NA, K, CL, CO2, GLUCOSE, BUN, CREATININE, CALCIUM, MG, PHOS in the last 168 hours. Liver Function Tests: No results for input(s): AST, ALT, ALKPHOS, BILITOT, PROT, ALBUMIN in the last 168 hours. No results for input(s): LIPASE, AMYLASE in the last 168 hours. No results for input(s): AMMONIA in the last 168 hours. CBC: No results for input(s): WBC, NEUTROABS, HGB, HCT, MCV, PLT in the last 168 hours. Cardiac Enzymes: No results for input(s): CKTOTAL, CKMB, CKMBINDEX, TROPONINI in the last 168 hours. BNP: Invalid input(s): POCBNP CBG: No results  for input(s): GLUCAP in the last 168 hours. D-Dimer No results for input(s): DDIMER in the last 72 hours. Hgb A1c No results for input(s): HGBA1C in the last 72 hours. Lipid Profile No results for input(s): CHOL, HDL, LDLCALC, TRIG, CHOLHDL, LDLDIRECT in the last 72 hours. Thyroid function studies No results for input(s): TSH, T4TOTAL, T3FREE, THYROIDAB in the last 72 hours.  Invalid input(s): FREET3 Anemia work up No results for input(s): VITAMINB12, FOLATE, FERRITIN, TIBC, IRON, RETICCTPCT in the last 72 hours. Urinalysis    Component Value Date/Time   COLORURINE YELLOW 11/07/2016 0858   APPEARANCEUR HAZY (A) 11/07/2016 0858   LABSPEC 1.014 11/07/2016 0858   PHURINE 6.0 11/07/2016 0858   GLUCOSEU 50 (A) 11/07/2016 0858   HGBUR MODERATE (A) 11/07/2016 0858   BILIRUBINUR NEGATIVE 11/07/2016 0858   KETONESUR 5 (A) 11/07/2016 0858   PROTEINUR 100 (A) 11/07/2016 0858   NITRITE POSITIVE (A) 11/07/2016 0858   LEUKOCYTESUR LARGE (A)  11/07/2016 0858   Sepsis Labs Invalid input(s): PROCALCITONIN,  WBC,  LACTICIDVEN Microbiology Recent Results (from the past 240 hour(s))  Culture, blood (Routine X 2) w Reflex to ID Panel     Status: None   Collection Time: 11/09/16  9:53 AM  Result Value Ref Range Status   Specimen Description BLOOD RIGHT ANTECUBITAL  Final   Special Requests   Final    BOTTLES DRAWN AEROBIC AND ANAEROBIC Blood Culture results may not be optimal due to an inadequate volume of blood received in culture bottles   Culture   Final    NO GROWTH 5 DAYS Performed at [facility], [address] [place], [place] [address]    Report Status 11/14/2016 FINAL  Final  Culture, blood (Routine X 2) w Reflex to ID Panel     Status: None   Collection Time: 11/09/16  9:57 AM  Result Value Ref Range Status   Specimen Description BLOOD LEFT ANTECUBITAL  Final   Special Requests   Final    BOTTLES DRAWN AEROBIC AND ANAEROBIC Blood Culture adequate volume   Culture   Final    NO GROWTH 5 DAYS Performed at [facility], [address] [place], [place] [address]    Report Status 11/14/2016 FINAL  Final     Time coordinating discharge: Over 30 minutes  SIGNED:   Kathlen ModyAKULA,Messiah Ahr, MD  [facility] 11/17/2016, 1:51 PM Pager   If 7PM-7AM, please contact night-coverage [email or web address] Password [email or web address]

## 2016-12-01 ENCOUNTER — Encounter: Payer: Self-pay | Admitting: Internal Medicine

## 2016-12-01 ENCOUNTER — Ambulatory Visit: Payer: Medicaid Other | Attending: Internal Medicine | Admitting: Internal Medicine

## 2016-12-01 VITALS — BP 115/77 | HR 78 | Temp 98.3°F | Resp 18 | Ht 66.0 in | Wt 220.0 lb

## 2016-12-01 DIAGNOSIS — E669 Obesity, unspecified: Secondary | ICD-10-CM | POA: Insufficient documentation

## 2016-12-01 DIAGNOSIS — Z6835 Body mass index (BMI) 35.0-35.9, adult: Secondary | ICD-10-CM | POA: Diagnosis not present

## 2016-12-01 DIAGNOSIS — E538 Deficiency of other specified B group vitamins: Secondary | ICD-10-CM | POA: Diagnosis not present

## 2016-12-01 DIAGNOSIS — Z79899 Other long term (current) drug therapy: Secondary | ICD-10-CM | POA: Insufficient documentation

## 2016-12-01 DIAGNOSIS — D500 Iron deficiency anemia secondary to blood loss (chronic): Secondary | ICD-10-CM | POA: Diagnosis not present

## 2016-12-01 DIAGNOSIS — Z87440 Personal history of urinary (tract) infections: Secondary | ICD-10-CM | POA: Diagnosis not present

## 2016-12-01 DIAGNOSIS — Z23 Encounter for immunization: Secondary | ICD-10-CM | POA: Diagnosis not present

## 2016-12-01 DIAGNOSIS — B9620 Unspecified Escherichia coli [E. coli] as the cause of diseases classified elsewhere: Secondary | ICD-10-CM

## 2016-12-01 DIAGNOSIS — N12 Tubulo-interstitial nephritis, not specified as acute or chronic: Secondary | ICD-10-CM

## 2016-12-01 DIAGNOSIS — N920 Excessive and frequent menstruation with regular cycle: Secondary | ICD-10-CM | POA: Insufficient documentation

## 2016-12-01 DIAGNOSIS — Z09 Encounter for follow-up examination after completed treatment for conditions other than malignant neoplasm: Secondary | ICD-10-CM | POA: Diagnosis present

## 2016-12-01 NOTE — Patient Instructions (Addendum)
Please purchase Vitamin B 12  500 mcg supplement and take one tablet twice a day.  Follow a Healthy Eating Plan - You can do it! Limit sugary drinks.  Avoid sodas, sweet tea, sport or energy drinks, or fruit drinks.  Drink water, lo-fat milk, or diet drinks. Limit snack foods.   Cut back on candy, cake, cookies, chips, ice cream.  These are a special treat, only in small amounts. Eat plenty of vegetables.  Especially dark green, red, and orange vegetables. Aim for at least 3 servings a day. More is better! Include fruit in your daily diet.  Whole fruit is much healthier than fruit juice! Limit "white" bread, "white" pasta, "white" rice.   Choose "100% whole grain" products, brown or wild rice. Avoid fatty meats. Try "Meatless Monday" and choose eggs or beans one day a week.  When eating meat, choose lean meats like chicken, [place]key, and fish.  Grill, broil, or bake meats instead of frying, and eat poultry without the skin. Eat less salt.  Avoid frozen pizzas, frozen dinners and salty foods.  Use seasonings other than salt in cooking.  This can help blood pressure and keep you from swelling Beer, wine and liquor have calories.  If you can safely drink alcohol, limit to 1 drink per day for women, 2 drinks for men

## 2016-12-01 NOTE — Progress Notes (Signed)
Patient ID: Cheryl Greene Stout, female    DOB: 02/01/1990  MRN: [id number]  CC: Hospitalization Follow-up   Subjective: Cheryl Greene Bergthold is a 26 y.o. female who presents for hosp f/u Her concerns today include:   Pt hosp from 10/26-27/2018 for acute pyelonephritis due to E.coli.  She was discharged with antibiotics which she has completed. -He is doing well.  No fever or dysuria. He has FMLA form that she would like for me to complete.  She return to work 11/12/2016.  2. Obesity: trying to lose wgh. Gained 8 lbs since hosp -cooks but not as much as she should -job is stationary, she snacks on junk during the day, eats fast foods.  -not getting in much exercise. Has a gym in her apartment building and also has an X-box that has an exercise program/app on it  3.  Noted to be anemic during hospitalization.  She gives history of iron deficiency anemia due to heavy periods.  She is on iron and birth control pills from her gynecologist.  Menses now last 5 days with heavy bleeding the first 3 days. -Patient also noted to have low normal vitamin B12 level.  He is not on B12 supplement  HM: due for Tdap, Flu. Had pap done this yr May. Reports it was normal.   Patient Active Problem List   Diagnosis Date Noted  . Sepsis (HCC) 11/07/2016     Current Outpatient Medications on File Prior to Visit  Medication Sig Dispense Refill  . acetaminophen (TYLENOL) 500 MG tablet Take 1,000 mg by mouth every 6 (six) hours as needed for mild pain.    . ferrous sulfate 325 (65 FE) MG EC tablet Take 1 tablet (325 mg total) by mouth 3 (three) times daily with meals. 100 tablet 3  . Multiple Vitamin (MULTIVITAMIN WITH MINERALS) TABS tablet Take 2 tablets by mouth daily.    . norgestrel-ethinyl estradiol (LO/OVRAL,CRYSELLE) 0.3-30 MG-MCG tablet Take 1 tablet by mouth daily. 1 Package 11   No current facility-administered medications on file prior to visit.     No Known Allergies  Social History    Socioeconomic History  . Marital status: Single    Spouse name: Not on file  . Number of children: Not on file  . Years of education: Not on file  . Highest education level: Not on file  Social Needs  . Financial resource strain: Not on file  . Food insecurity - worry: Not on file  . Food insecurity - inability: Not on file  . Transportation needs - medical: Not on file  . Transportation needs - non-medical: Not on file  Occupational History  . Not on file  Tobacco Use  . Smoking status: Never Smoker  . Smokeless tobacco: Never Used  Substance and Sexual Activity  . Alcohol use: No    Comment: occassionally  . Drug use: No  . Sexual activity: Yes    Partners: Male    Birth control/protection: None  Other Topics Concern  . Not on file  Social History Narrative  . Not on file    History reviewed. No pertinent family history.  Past Surgical History:  Procedure Laterality Date  . c section      ROS: Review of Systems  Constitutional: Negative for fatigue and fever.       Not exercising  Respiratory: Negative for chest tightness and shortness of breath.   Cardiovascular: Negative for chest pain.  Genitourinary: Positive for menstrual problem (see hx above). Negative  for difficulty urinating.  Neurological: Negative for dizziness.   Negative except as stated above PHYSICAL EXAM: BP 115/77 (BP Location: Left Arm, Patient Position: Sitting, Cuff Size: Large)   Pulse 78   Temp 98.3 F (36.8 C) (Oral)   Resp 18   Ht 5\' 6"  (1.676 m)   Wt 220 lb (99.8 kg)   SpO2 99%   BMI 35.51 kg/m   Wt Readings from Last 3 Encounters:  12/01/16 220 lb (99.8 kg)  11/10/16 212 lb 1.3 oz (96.2 kg)  05/14/16 213 lb 8 oz (96.8 kg)    Physical Exam  General appearance - alert, well appearing, obese young AAFand in no distress Mental status - alert, oriented to person, place, and time, normal mood, behavior, speech, dress, motor activity, and thought processes Eyes - pupils  equal and reactive, extraocular eye movements intact Mouth - mucous membranes moist, pharynx normal without lesions Chest - clear to auscultation, no wheezes, rales or rhonchi, symmetric air entry Heart - normal rate, regular rhythm, normal S1, S2, no murmurs, rubs, clicks or gallops Extremities - peripheral pulses normal, no pedal edema, no clubbing or cyanosis  Lab Results  Component Value Date   WBC 9.2 11/09/2016   HGB 10.0 (L) 11/09/2016   HCT 30.7 (L) 11/09/2016   MCV 88.7 11/09/2016   PLT 243 11/09/2016   Lab Results  Component Value Date   IRON 256 (H) 11/09/2016   TIBC 372 11/09/2016   FERRITIN 25 11/09/2016     ASSESSMENT AND PLAN: 1. Pyelonephritis due to Escherichia coli Resolved. I will complete her FMLA form and fax it in for her  2. Iron deficiency anemia due to chronic blood loss Continue iron supplement  3. Vitamin B12 deficiency Recommend vitamin B12 500 MCG's twice daily.  She will purchase over-the-counter  4. Obesity (BMI 30-39.9) -Patient counseled on importance of healthy eating habits and regular exercise to achieve weight loss and prevent chronic illnesses associated with obesity. -Set goal to prepare lunch at home and take it with her to work every day. -Discussed limiting white carbohydrates, limiting sweet drinks and getting in some form of aerobic exercise 3-4 times a week for 30 minutes.  5. Need for influenza vaccination - Flu Vaccine QUAD 6+ mos PF IM (Fluarix Quad PF)  6. Need for Tdap vaccination - Tdap vaccine greater than or equal to 7yo IM  Patient was given the opportunity to ask questions.  Patient verbalized understanding of the plan and was able to repeat key elements of the plan.   Orders Placed This Encounter  Procedures  . Flu Vaccine QUAD 6+ mos PF IM (Fluarix Quad PF)  . Tdap vaccine greater than or equal to 7yo IM     Requested Prescriptions    No prescriptions requested or ordered in this encounter    Return if  symptoms worsen or fail to improve.  Jonah Blueeborah Johnson, MD, FACP

## 2019-03-24 IMAGING — CR DG CHEST 2V
2 series · 2 of 2 positions shown · non-contrast
Comparison: None.

CLINICAL DATA: Upper back pain, shortness of Breath

EXAM:
CHEST  2 VIEW

[w chest pa]
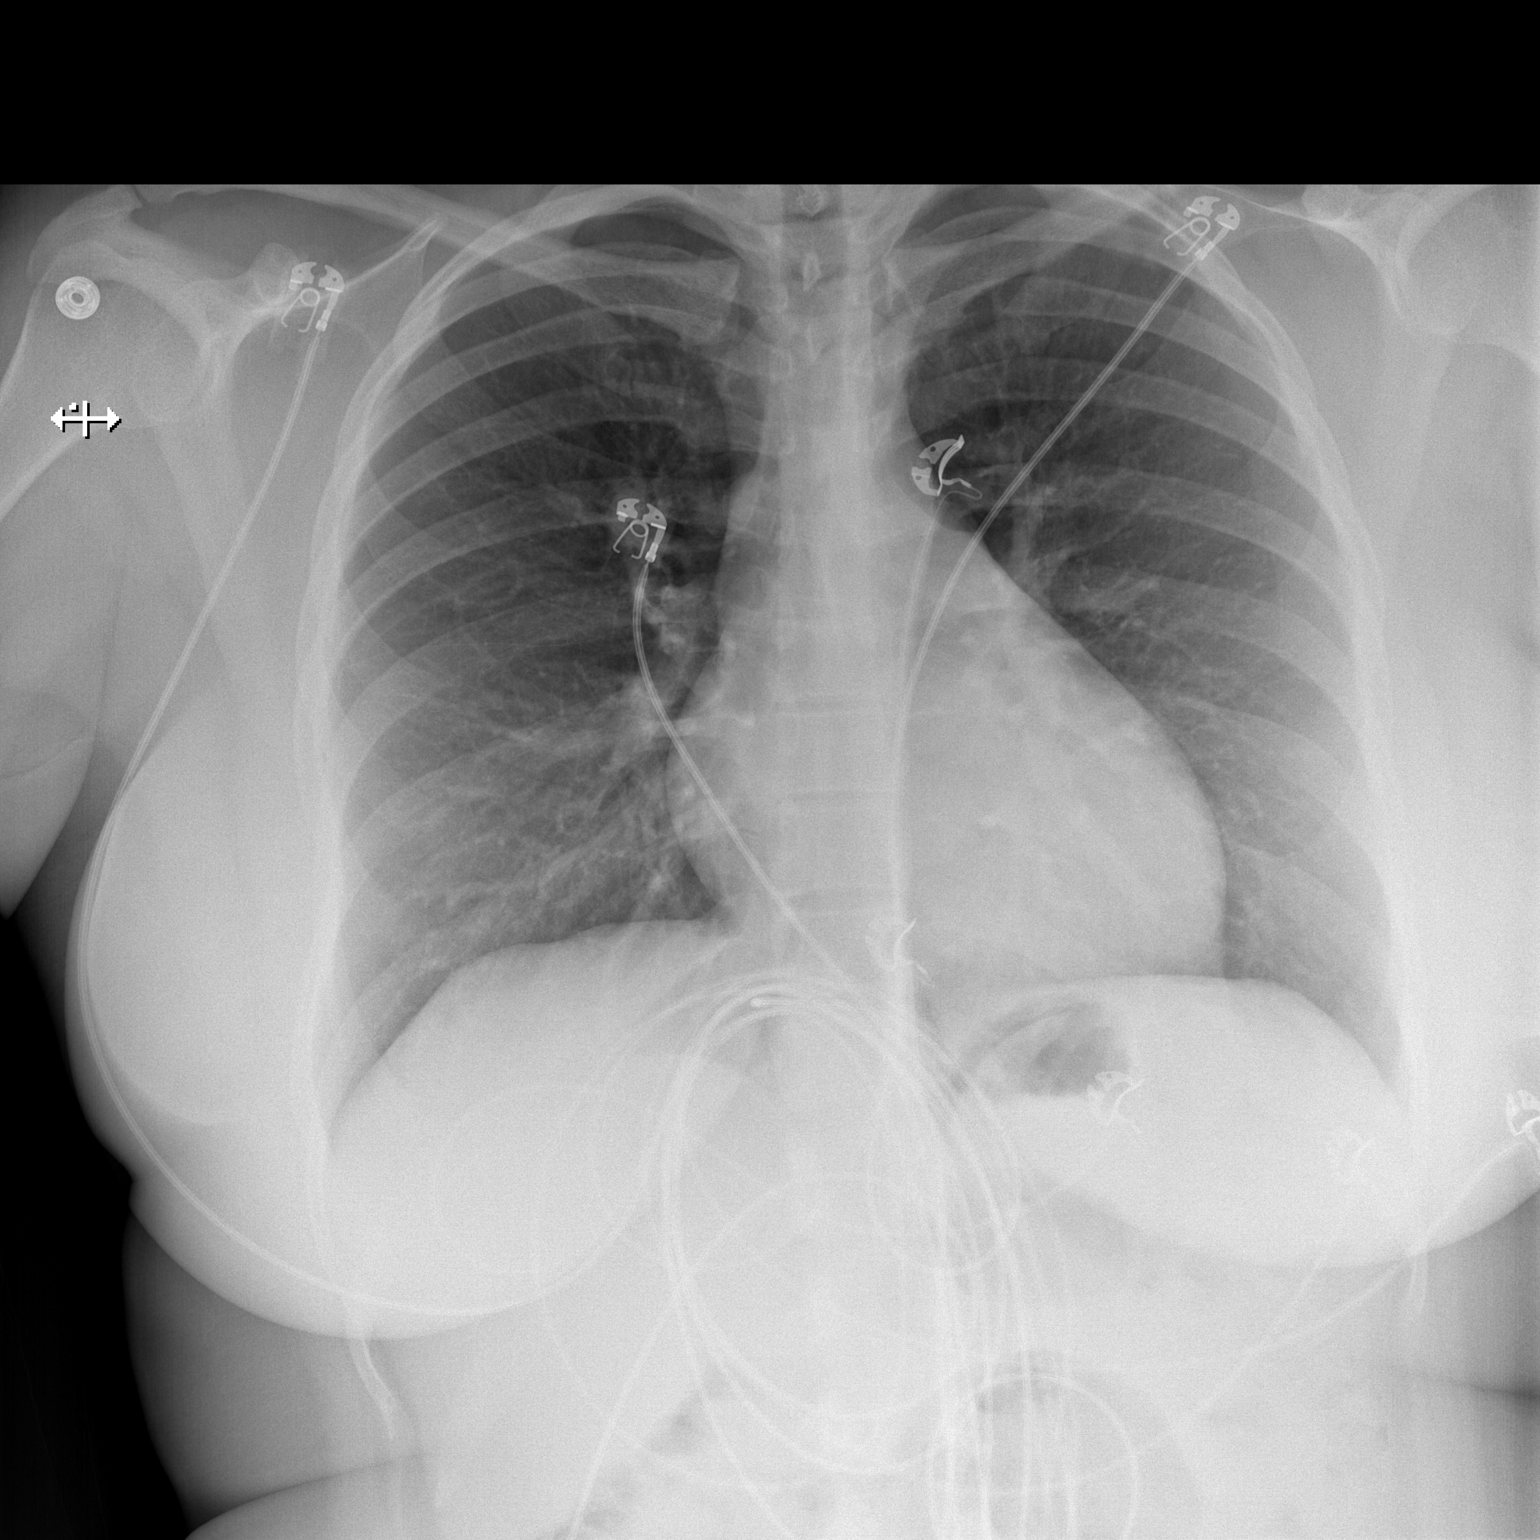

[w chest lat]
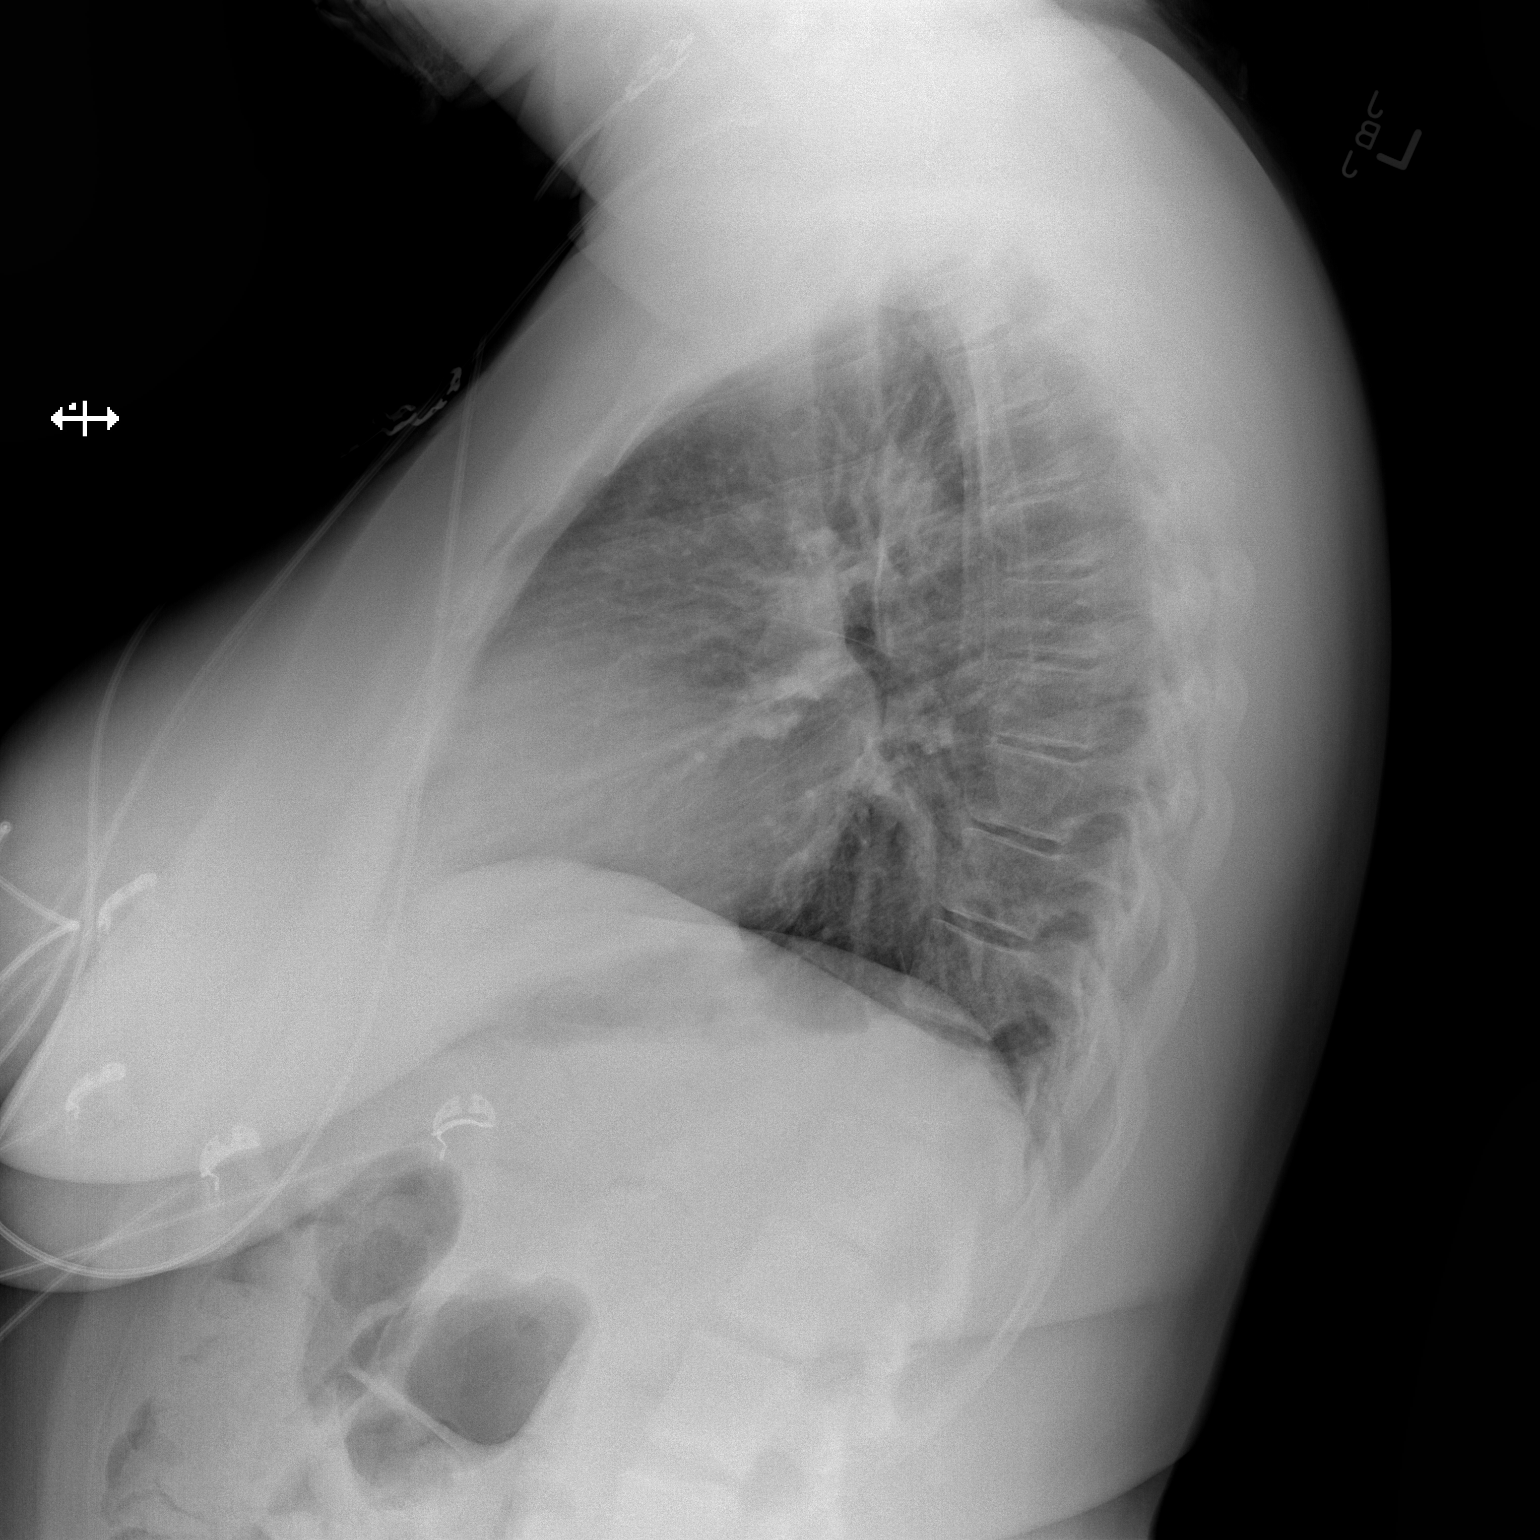

[2 of 2 positions shown; findings below may reference images not displayed]

FINDINGS: Heart and mediastinal contours are within normal limits. No focal
opacities or effusions. No acute bony abnormality.
IMPRESSION: No active cardiopulmonary disease.

## 2019-03-24 IMAGING — CT CT RENAL STONE PROTOCOL
2 of 3 series · 17 of 46 positions shown, 19 images · non-contrast
Comparison: None.

CLINICAL DATA: Back pain for several hours

EXAM:
CT ABDOMEN AND PELVIS WITHOUT CONTRAST
TECHNIQUE: Multidetector CT imaging of the abdomen and pelvis was performed
following the standard protocol without IV contrast.

[Series 3: lung · axial · 0.77mm/px · z∈[+1356,+1432]mm · 14 of 44 slices shown, 16 images]
[im 3/44  soft-tissue]
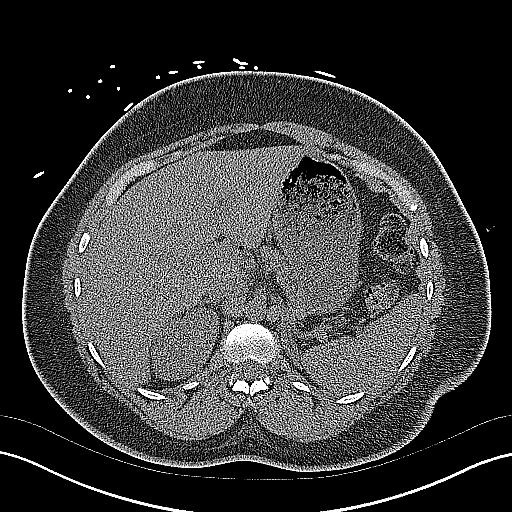
[im 3/44  bone]
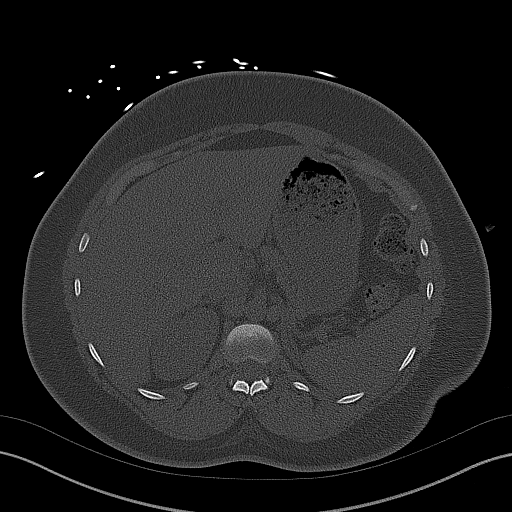
[im 6/44  soft-tissue]
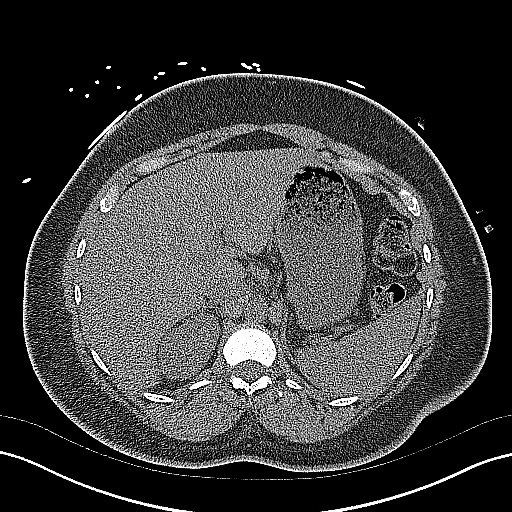
[im 9/44  soft-tissue]
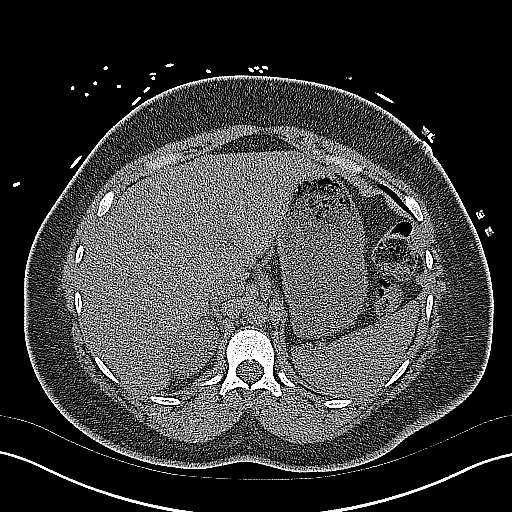
[im 12/44  soft-tissue]
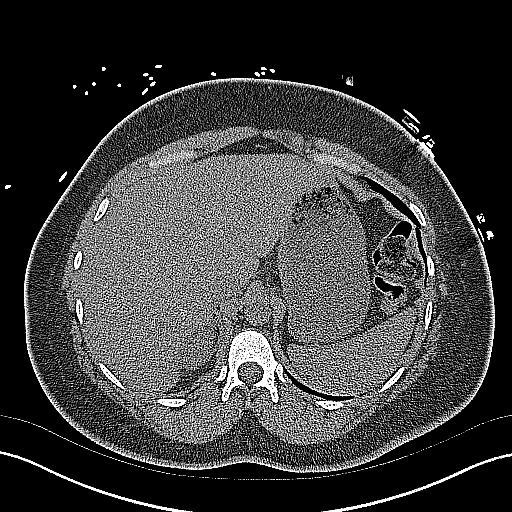
[im 14/44  soft-tissue]
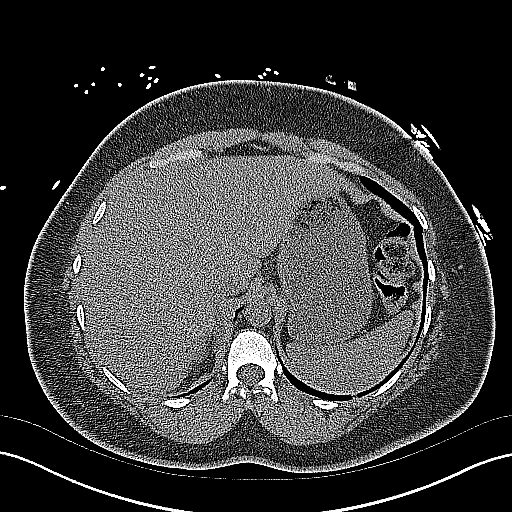
[im 17/44  soft-tissue]
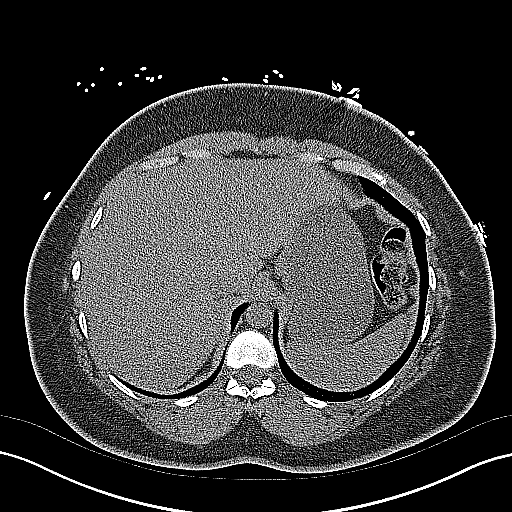
[im 20/44  soft-tissue]
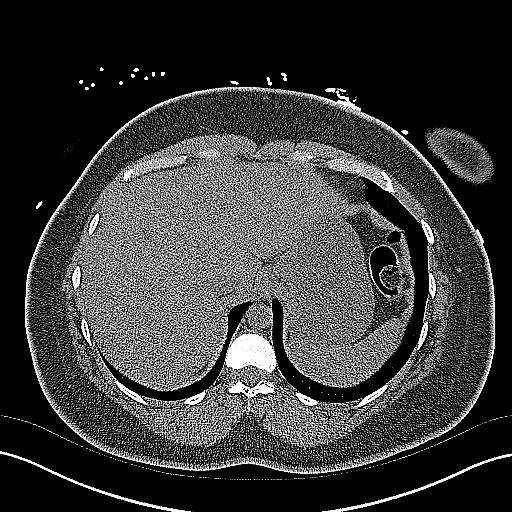
[im 24/44  soft-tissue]
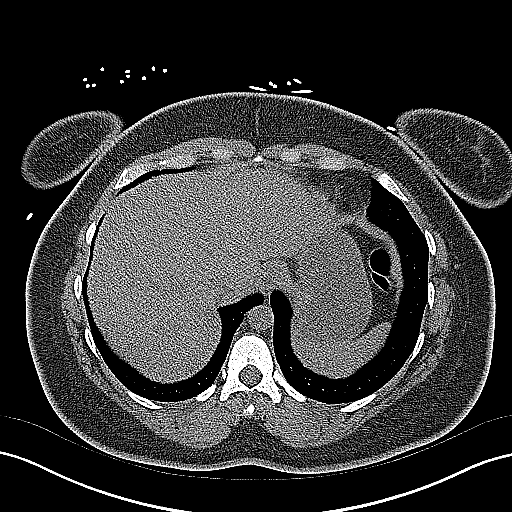
[im 27/44  soft-tissue]
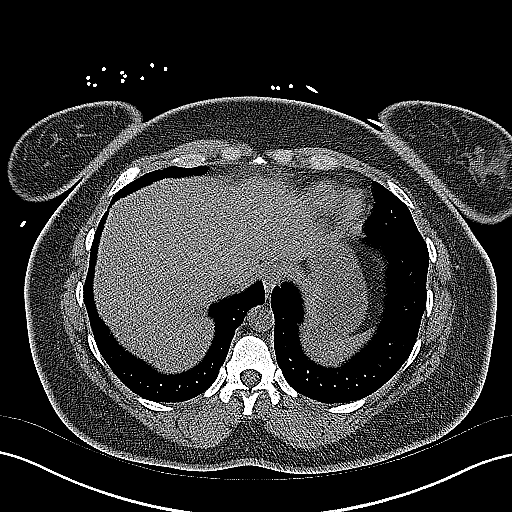
[im 27/44  bone]
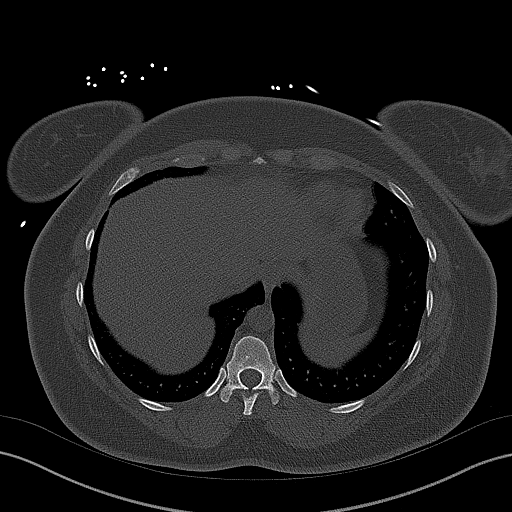
[im 30/44  soft-tissue]
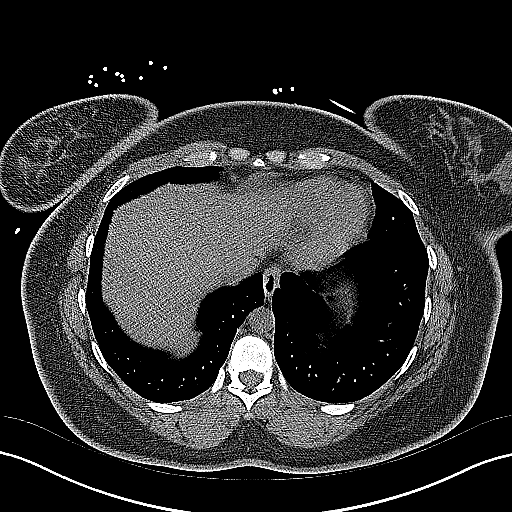
[im 32/44  soft-tissue]
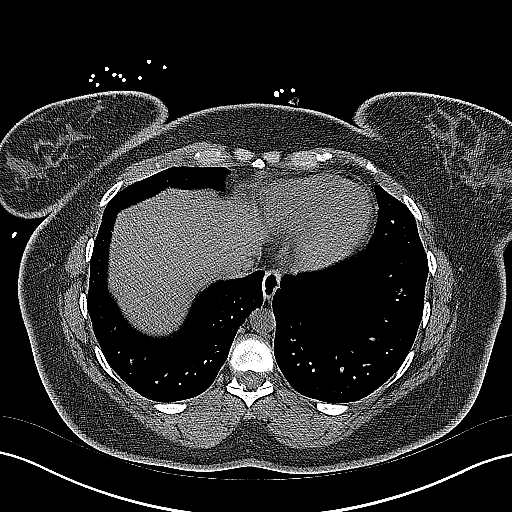
[im 35/44  soft-tissue]
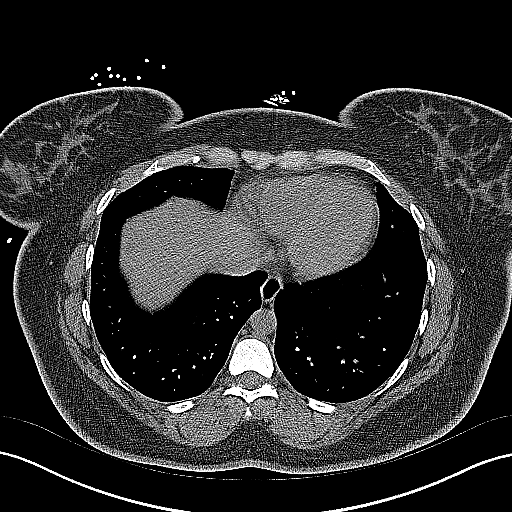
[im 38/44  soft-tissue]
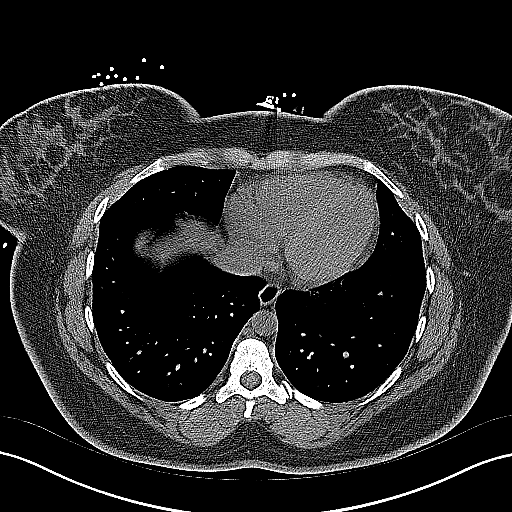
[im 41/44  soft-tissue]
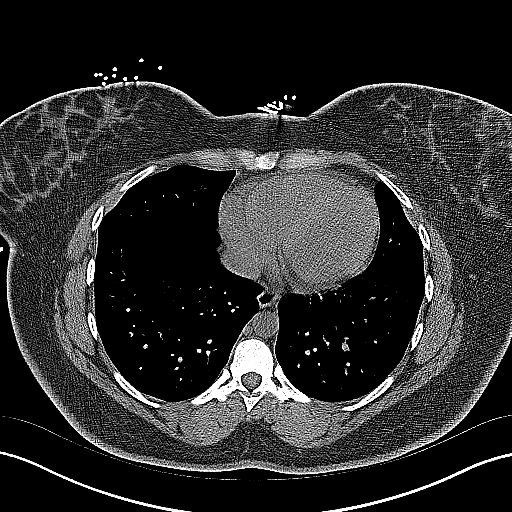

[Series 5: coronal · coronal · 0.84mm/px · 3 of 166 slices shown]
[im 56/166  soft-tissue]
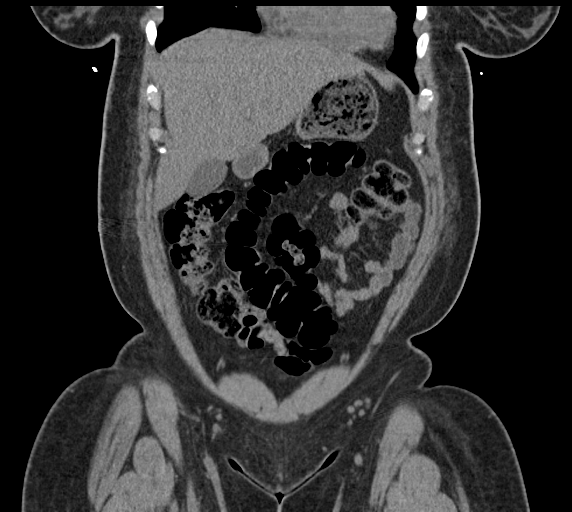
[im 74/166  soft-tissue]
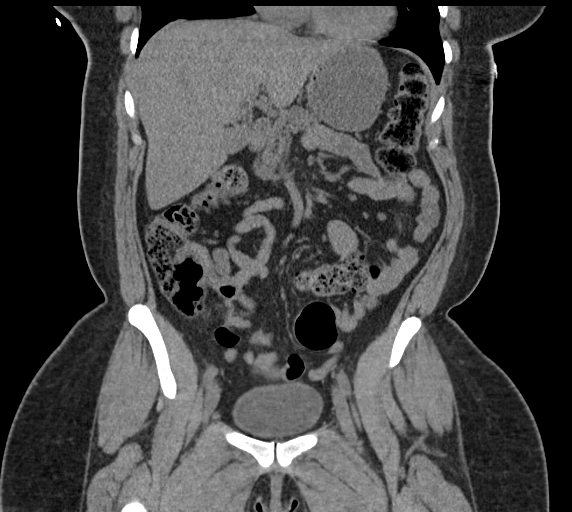
[im 92/166  soft-tissue]
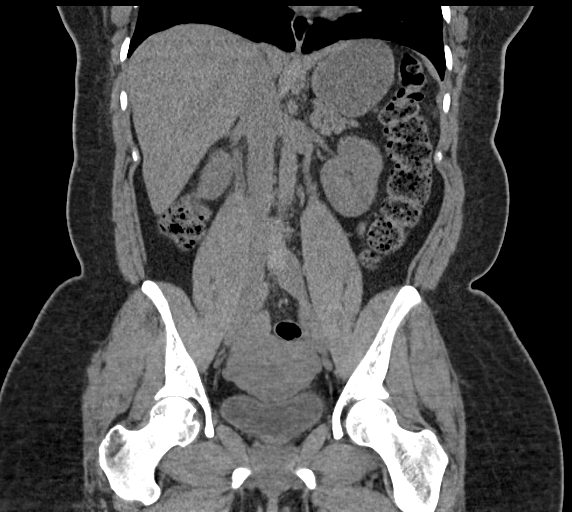

[17 of 46 positions shown; findings below may reference images not displayed]

FINDINGS: Lower chest: No acute abnormality.

Hepatobiliary: No focal liver abnormality is seen. No gallstones,
gallbladder wall thickening, or biliary dilatation.

Pancreas: Unremarkable. No pancreatic ductal dilatation or
surrounding inflammatory changes.

Spleen: Normal in size without focal abnormality.

Adrenals/Urinary Tract: Adrenal glands are unremarkable. Kidneys are
normal, without renal calculi, focal lesion, or hydronephrosis.
Bladder is unremarkable.

Stomach/Bowel: The appendix is within normal limits. No obstructive
or inflammatory changes are noted.

Vascular/Lymphatic: No significant vascular findings are present. No
enlarged abdominal or pelvic lymph nodes.

Reproductive: Uterus and bilateral adnexa are unremarkable.

Other: No abdominal wall hernia or abnormality. No abdominopelvic
ascites.

Musculoskeletal: No acute bony abnormality is noted. A sclerotic
focus is noted in the right iliac bone recent to the sacroiliac
joint likely related to underlying degenerative change. Mild
spurring at the sacroiliac joint is noted.
IMPRESSION: No acute abnormality is noted.
